# Patient Record
Sex: Female | Born: 1960
Health system: Southern US, Community
[De-identification: ages and names within clinical notes are randomized; demographics above are authoritative.]

## PROBLEM LIST (undated history)

## (undated) DIAGNOSIS — Z808 Family history of malignant neoplasm of other organs or systems: Secondary | ICD-10-CM

## (undated) DIAGNOSIS — Z8041 Family history of malignant neoplasm of ovary: Secondary | ICD-10-CM

## (undated) HISTORY — DX: Family history of malignant neoplasm of ovary: Z80.41

## (undated) HISTORY — DX: Family history of malignant neoplasm of other organs or systems: Z80.8

## (undated) HISTORY — PX: WISDOM TOOTH EXTRACTION: SHX21

---

## 1999-05-08 ENCOUNTER — Encounter: Payer: Self-pay | Admitting: Obstetrics and Gynecology

## 1999-05-08 ENCOUNTER — Ambulatory Visit (HOSPITAL_COMMUNITY): Admission: RE | Admit: 1999-05-08 | Discharge: 1999-05-08 | Payer: Self-pay | Admitting: Obstetrics and Gynecology

## 2000-05-08 ENCOUNTER — Encounter: Payer: Self-pay | Admitting: Obstetrics and Gynecology

## 2000-05-08 ENCOUNTER — Ambulatory Visit (HOSPITAL_COMMUNITY): Admission: RE | Admit: 2000-05-08 | Discharge: 2000-05-08 | Payer: Self-pay | Admitting: Obstetrics and Gynecology

## 2001-04-07 ENCOUNTER — Other Ambulatory Visit: Admission: RE | Admit: 2001-04-07 | Discharge: 2001-04-07 | Payer: Self-pay | Admitting: Obstetrics and Gynecology

## 2001-05-15 ENCOUNTER — Encounter: Payer: Self-pay | Admitting: Obstetrics and Gynecology

## 2001-05-15 ENCOUNTER — Encounter: Admission: RE | Admit: 2001-05-15 | Discharge: 2001-05-15 | Payer: Self-pay | Admitting: Obstetrics and Gynecology

## 2002-05-25 ENCOUNTER — Encounter: Payer: Self-pay | Admitting: Obstetrics and Gynecology

## 2002-05-25 ENCOUNTER — Encounter: Admission: RE | Admit: 2002-05-25 | Discharge: 2002-05-25 | Payer: Self-pay | Admitting: Obstetrics and Gynecology

## 2003-02-18 ENCOUNTER — Other Ambulatory Visit: Admission: RE | Admit: 2003-02-18 | Discharge: 2003-02-18 | Payer: Self-pay | Admitting: Obstetrics and Gynecology

## 2003-06-02 ENCOUNTER — Encounter: Payer: Self-pay | Admitting: Obstetrics and Gynecology

## 2003-06-02 ENCOUNTER — Encounter: Admission: RE | Admit: 2003-06-02 | Discharge: 2003-06-02 | Payer: Self-pay | Admitting: Obstetrics and Gynecology

## 2004-03-28 ENCOUNTER — Other Ambulatory Visit: Admission: RE | Admit: 2004-03-28 | Discharge: 2004-03-28 | Payer: Self-pay | Admitting: Obstetrics and Gynecology

## 2005-04-03 ENCOUNTER — Other Ambulatory Visit: Admission: RE | Admit: 2005-04-03 | Discharge: 2005-04-03 | Payer: Self-pay | Admitting: Obstetrics and Gynecology

## 2005-04-27 ENCOUNTER — Encounter: Admission: RE | Admit: 2005-04-27 | Discharge: 2005-04-27 | Payer: Self-pay | Admitting: Obstetrics and Gynecology

## 2006-04-12 ENCOUNTER — Other Ambulatory Visit: Admission: RE | Admit: 2006-04-12 | Discharge: 2006-04-12 | Payer: Self-pay | Admitting: Obstetrics and Gynecology

## 2006-05-03 ENCOUNTER — Encounter: Admission: RE | Admit: 2006-05-03 | Discharge: 2006-05-03 | Payer: Self-pay | Admitting: Obstetrics and Gynecology

## 2007-05-06 ENCOUNTER — Encounter: Admission: RE | Admit: 2007-05-06 | Discharge: 2007-05-06 | Payer: Self-pay | Admitting: Obstetrics and Gynecology

## 2008-05-06 ENCOUNTER — Encounter: Admission: RE | Admit: 2008-05-06 | Discharge: 2008-05-06 | Payer: Self-pay | Admitting: Obstetrics and Gynecology

## 2009-05-09 ENCOUNTER — Encounter: Admission: RE | Admit: 2009-05-09 | Discharge: 2009-05-09 | Payer: Self-pay | Admitting: Obstetrics and Gynecology

## 2010-05-15 ENCOUNTER — Encounter: Admission: RE | Admit: 2010-05-15 | Discharge: 2010-05-15 | Payer: Self-pay | Admitting: Obstetrics and Gynecology

## 2011-04-09 ENCOUNTER — Other Ambulatory Visit: Payer: Self-pay | Admitting: Obstetrics and Gynecology

## 2011-04-09 DIAGNOSIS — Z1231 Encounter for screening mammogram for malignant neoplasm of breast: Secondary | ICD-10-CM

## 2011-05-18 ENCOUNTER — Ambulatory Visit
Admission: RE | Admit: 2011-05-18 | Discharge: 2011-05-18 | Disposition: A | Discharge status: Home / Self Care | Payer: Private Health Insurance - Indemnity | Source: Ambulatory Visit | Attending: Obstetrics and Gynecology | Admitting: Obstetrics and Gynecology

## 2011-05-18 DIAGNOSIS — Z1231 Encounter for screening mammogram for malignant neoplasm of breast: Secondary | ICD-10-CM

## 2012-04-14 ENCOUNTER — Other Ambulatory Visit: Payer: Self-pay | Admitting: Obstetrics and Gynecology

## 2012-04-14 DIAGNOSIS — Z1231 Encounter for screening mammogram for malignant neoplasm of breast: Secondary | ICD-10-CM

## 2012-05-22 ENCOUNTER — Ambulatory Visit: Payer: Private Health Insurance - Indemnity

## 2012-06-16 ENCOUNTER — Ambulatory Visit
Admission: RE | Admit: 2012-06-16 | Discharge: 2012-06-16 | Disposition: A | Discharge status: Home / Self Care | Payer: 59 | Source: Ambulatory Visit | Attending: Obstetrics and Gynecology | Admitting: Obstetrics and Gynecology

## 2012-06-16 DIAGNOSIS — Z1231 Encounter for screening mammogram for malignant neoplasm of breast: Secondary | ICD-10-CM

## 2013-05-11 ENCOUNTER — Other Ambulatory Visit: Payer: Self-pay

## 2013-05-11 DIAGNOSIS — Z1231 Encounter for screening mammogram for malignant neoplasm of breast: Secondary | ICD-10-CM

## 2013-06-17 ENCOUNTER — Ambulatory Visit
Admission: RE | Admit: 2013-06-17 | Discharge: 2013-06-17 | Disposition: A | Discharge status: Home / Self Care | Payer: 59 | Source: Ambulatory Visit

## 2013-06-17 DIAGNOSIS — Z1231 Encounter for screening mammogram for malignant neoplasm of breast: Secondary | ICD-10-CM

## 2014-05-13 ENCOUNTER — Other Ambulatory Visit: Payer: Self-pay

## 2014-05-13 DIAGNOSIS — Z1231 Encounter for screening mammogram for malignant neoplasm of breast: Secondary | ICD-10-CM

## 2014-06-22 ENCOUNTER — Ambulatory Visit
Admission: RE | Admit: 2014-06-22 | Discharge: 2014-06-22 | Disposition: A | Discharge status: Home / Self Care | Payer: 59 | Source: Ambulatory Visit

## 2014-06-22 DIAGNOSIS — Z1231 Encounter for screening mammogram for malignant neoplasm of breast: Secondary | ICD-10-CM

## 2014-11-03 ENCOUNTER — Ambulatory Visit (HOSPITAL_BASED_OUTPATIENT_CLINIC_OR_DEPARTMENT_OTHER): Payer: 59 | Admitting: Genetic Counselor

## 2014-11-03 ENCOUNTER — Encounter: Payer: Self-pay | Admitting: Genetic Counselor

## 2014-11-03 ENCOUNTER — Ambulatory Visit: Payer: Self-pay

## 2014-11-03 DIAGNOSIS — Z8041 Family history of malignant neoplasm of ovary: Secondary | ICD-10-CM

## 2014-11-03 DIAGNOSIS — Z808 Family history of malignant neoplasm of other organs or systems: Secondary | ICD-10-CM | POA: Insufficient documentation

## 2014-11-03 NOTE — Progress Notes (Signed)
REFERRING PROVIDER: Dr. Mancel Bale  PRIMARY PROVIDER:  No primary care provider on file.  PRIMARY REASON FOR VISIT:  1. Family history of ovarian cancer   2. Family history of glioblastoma      HISTORY OF PRESENT ILLNESS:   Margaret Parks, a 54 y.o. female, was seen for a [place] cancer genetics consultation at the request of Dr. Mancel Bale due to a family history of cancer.  Margaret Parks presents to clinic today to discuss the possibility of a hereditary predisposition to cancer, genetic testing, and to further clarify her future cancer risks, as well as potential cancer risks for family members. Margaret Parks is a 54 y.o. female with no personal history of cancer.    CANCER HISTORY:   No history exists.     HORMONAL RISK FACTORS:  Menarche was at age 2.  First live birth at age 85.  OCP use for approximately 0 years.  Ovaries intact: yes.  Hysterectomy: no.  Menopausal status: postmenopausal.  HRT use: 0 years. Colonoscopy: yes; normal. Mammogram within the last year: yes. Number of breast biopsies: 0. Up to date with pelvic exams:  yes. Any excessive radiation exposure in the past:  no  Past Medical History  Diagnosis Date  . Family history of ovarian cancer   . Family history of glioblastoma     Past Surgical History  Procedure Laterality Date  . Wisdom tooth extraction      History   Social History  . Marital Status: Married    Spouse Name: N/A  . Number of Children: 2  . Years of Education: N/A   Social History Main Topics  . Smoking status: Never Smoker   . Smokeless tobacco: Not on file  . Alcohol Use: Yes     Comment: very occasional  . Drug Use: Not on file  . Sexual Activity: Not on file   Other Topics Concern  . None   Social History Narrative  . None     FAMILY HISTORY:  We obtained a detailed, 4-generation family history.  Significant diagnoses are listed below: Family History  Problem Relation Age of Onset  . Stroke Mother   . Heart attack  Father   . Ovarian cancer Sister 36    unilateral fallopian tube cancer  . Colon polyps Brother   . Ovarian cancer Paternal Aunt 48  . Heart attack Paternal Uncle   . Multiple myeloma Maternal Grandmother   . Bone cancer Maternal Grandfather   . Heart attack Paternal Grandfather   . Brain cancer Brother 30    glioblastoma  . Lung cancer Maternal Uncle    Margaret Parks has one sister and two brothers.  Her youngest brother had a glioblastoma diagnosed at 31 and died at 10.  Her sister recently underwent a hysterectomy and an early fallopian tube cancer was found.  No chemotherapy was needed for this. Her father died of a heart attack, as did one of his brothers.  Her father's sister died of ovarian cancer.  The paternal grandparents died of heart attack and old age.  Margaret Parks maternal family history is non contributory. Patient's maternal ancestors are of [place] and [place] descent, and paternal ancestors are of [place] and [place] descent. There is no reported Ashkenazi Jewish ancestry. There is no known consanguinity.  GENETIC COUNSELING ASSESSMENT: Margaret Parks is a 54 y.o. female with a family history of ovarian cancer which somewhat suggestive of a hereditary cancer syndrome and predisposition to cancer. We, therefore, discussed and  recommended the following at today's visit.   DISCUSSION: We reviewed the characteristics, features and inheritance patterns of hereditary cancer syndromes. We discussed that the most common cause of hereditary ovarian cancer is the result of a BRCA mutation.  Lynch syndrome also makes up a good proportion of hereditary ovarian cancer.  Less common genes can increase the risk as well.  We also discussed genetic testing, including the appropriate family members to test, the process of testing, insurance coverage and turn-around-time for results. We discussed the implications of a negative, positive and/or variant of uncertain significant result. Based on her  sister's diagnosis we discussed that her sister is a more informative person to perform genetic testing upon.  Margaret Parks does meet criteria, but having her sister tested could provide a specific gene to test for.  If Margaret Parks decides to undergo genetic testing, we recommended Margaret Parks pursue genetic testing for the Breast/ovarian cnacer gene panel. The Breast/Ovarian gene panel offered by [facility] includes sequencing and rearrangement analysis for the following 21 genes:  ATM, BARD1, BRCA1, BRCA2, BRIP1, CDH1, CHEK2, EPCAM, FANCC, MLH1, MSH2, MSH6, NBN, PALB2, PMS2, PTEN, RAD51C, RAD51D, STK11, TP53, and XRCC2.     PLAN: Despite our recommendation, Margaret Parks did not wish to pursue genetic testing at today's visit. We understand this decision, and remain available to coordinate genetic testing at any time in the future. Based on Margaret Parks's family history, we recommended her sister, who was diagnosed with fallopian tube cancer at age 55, have genetic counseling and testing. Margaret Parks will let us know if we can be of any assistance in coordinating genetic counseling and/or testing for this family member. Once we have her sister's test results back, Margaret Parks will r/s her genetic testing appointment.  Lastly, we encouraged Margaret Parks to remain in contact with cancer genetics annually so that we can continuously update the family history and inform her of any changes in cancer genetics and testing that may be of benefit for this family.   Ms.  Parks questions were answered to her satisfaction today. Our contact information was provided should additional questions or concerns arise. Thank you for the referral and allowing [place] to share in the care of your patient.   Rosine Solecki P. Florene Glen, [place], [facility] Certified Genetic Counselor [email or web address] phone: [phone]  The patient was seen for a total of 60 minutes in face-to-face genetic counseling.  This patient was discussed with Drs. Magrinat,  Lindi Adie and/or Burr Medico who agrees with the above.    _______________________________________________________________________ For Office Staff:  Number of people involved in session: 1 Was an Intern/ student involved with case: no

## 2014-11-15 ENCOUNTER — Telehealth: Payer: Self-pay | Admitting: Genetic Counselor

## 2014-11-15 NOTE — Telephone Encounter (Signed)
Pt appt with  Lab for genetic testing is 11/16/14@9 :45

## 2014-11-16 ENCOUNTER — Other Ambulatory Visit: Payer: 59

## 2014-12-06 ENCOUNTER — Encounter: Payer: Self-pay | Admitting: Genetic Counselor

## 2014-12-06 ENCOUNTER — Telehealth: Payer: Self-pay | Admitting: Genetic Counselor

## 2014-12-06 DIAGNOSIS — Z8041 Family history of malignant neoplasm of ovary: Secondary | ICD-10-CM

## 2014-12-06 DIAGNOSIS — Z1379 Encounter for other screening for genetic and chromosomal anomalies: Secondary | ICD-10-CM

## 2014-12-06 NOTE — Telephone Encounter (Signed)
Revealed negative genetic testing on the Breast/Ovarian cancer panel.  Discussed that genetic testing is most informative on someone who has had cancer. Her sister, who had fallopian tube cancer, would be the most informative person for genetic testing in Shaana's family.  Ms. Margaret Parks understands, but is happy with her negative results.

## 2014-12-06 NOTE — Progress Notes (Signed)
HPI: Ms. Woolford was previously seen in the [place] clinic due to a family history of cancer and concerns regarding a hereditary predisposition to cancer. Please refer to our prior cancer genetics clinic note for more information regarding Ms. Wendorff's medical, social and family histories, and our assessment and recommendations, at the time. Ms. Alcaide recent genetic test results were disclosed to her, as were recommendations warranted by these results. These results and recommendations are discussed in more detail below.  GENETIC TEST RESULTS: At the time of Ms. Mcelroy's visit, we recommended she pursue genetic testing of the Breast/Ovarian cancer gene panel. The Breast/Ovarian gene panel offered by [facility] includes sequencing and rearrangement analysis for the following 21 genes:  ATM, BARD1, BRCA1, BRCA2, BRIP1, CDH1, CHEK2, EPCAM, FANCC, MLH1, MSH2, MSH6, NBN, PALB2, PMS2, PTEN, RAD51C, RAD51D, STK11, TP53, and XRCC2.   The report date is December 01, 2014.  Genetic testing was normal, and did not reveal a deleterious mutation in these genes. The test report has been scanned into EPIC and is located under the Media tab.   We discussed with Ms. Thorley that since the current genetic testing is not perfect, it is possible there may be a gene mutation in one of these genes that current testing cannot detect, but that chance is small. We also discussed, that it is possible that another gene that has not yet been discovered, or that we have not yet tested, is responsible for the cancer diagnoses in the family, and it is, therefore, important to remain in touch with cancer genetics in the future so that we can continue to offer Ms. Montoro the most up to date genetic testing.   CANCER SCREENING RECOMMENDATIONS: This normal result is reassuring and indicates that Ms. Hersch does not likely have an increased risk of cancer due to a mutation in one of these genes.  We, therefore, recommended  Ms.  Cast continue to follow the cancer screening guidelines provided by her primary healthcare providers.   CANCER SCREENING RECOMMENDATIONS: This result is reassuring and suggests that Ms. Chaudhari likely does not have an increased risk for a future cancer due to a mutation in one of these genes.  This normal test also suggests and Ms. Gsell is not at high risk for developing cancer due to an inherited predisposition associated with one of these genes. We, therefore, recommended she continue to follow the cancer management and screening guidelines provided by her oncology and primary providers.   RECOMMENDATIONS FOR FAMILY MEMBERS: Women in this family might be at some increased risk of developing cancer, over the general population risk, simply due to the family history of cancer. We recommended women in this family have a yearly mammogram beginning at age 29, or 27 years younger than the earliest onset of cancer, an an annual clinical breast exam, and perform monthly breast self-exams. Women in this family should also have a gynecological exam as recommended by their primary provider. All family members should have a colonoscopy by age 60.  FOLLOW-UP: Lastly, we discussed with Ms. Santini that cancer genetics is a rapidly advancing field and it is possible that new genetic tests will be appropriate for her and/or her family members in the future. We encouraged her to remain in contact with cancer genetics on an annual basis so we can update her personal and family histories and let her know of advances in cancer genetics that may benefit this family.   Our contact number was provided. Ms. Fehr questions  were answered to her satisfaction, and she knows she is welcome to call us at anytime with additional questions or concerns.   Roma Kayser, MS, [facility] Certified Genetic Counselor [email or web address]

## 2015-04-13 DIAGNOSIS — M79673 Pain in unspecified foot: Secondary | ICD-10-CM

## 2015-05-23 ENCOUNTER — Other Ambulatory Visit: Payer: Self-pay

## 2015-05-23 DIAGNOSIS — Z1231 Encounter for screening mammogram for malignant neoplasm of breast: Secondary | ICD-10-CM

## 2015-06-28 ENCOUNTER — Ambulatory Visit
Admission: RE | Admit: 2015-06-28 | Discharge: 2015-06-28 | Disposition: A | Discharge status: Home / Self Care | Payer: Commercial Managed Care - PPO | Source: Ambulatory Visit

## 2015-06-28 DIAGNOSIS — Z1231 Encounter for screening mammogram for malignant neoplasm of breast: Secondary | ICD-10-CM

## 2016-05-25 ENCOUNTER — Other Ambulatory Visit: Payer: Self-pay | Admitting: Obstetrics and Gynecology

## 2016-05-25 DIAGNOSIS — Z1231 Encounter for screening mammogram for malignant neoplasm of breast: Secondary | ICD-10-CM

## 2016-07-03 ENCOUNTER — Ambulatory Visit
Admission: RE | Admit: 2016-07-03 | Discharge: 2016-07-03 | Disposition: A | Discharge status: Home / Self Care | Payer: Commercial Managed Care - PPO | Source: Ambulatory Visit | Attending: Obstetrics and Gynecology | Admitting: Obstetrics and Gynecology

## 2016-07-03 DIAGNOSIS — Z1231 Encounter for screening mammogram for malignant neoplasm of breast: Secondary | ICD-10-CM

## 2017-05-28 ENCOUNTER — Other Ambulatory Visit: Payer: Self-pay | Admitting: Obstetrics and Gynecology

## 2017-05-28 DIAGNOSIS — Z1231 Encounter for screening mammogram for malignant neoplasm of breast: Secondary | ICD-10-CM

## 2017-06-19 DIAGNOSIS — D485 Neoplasm of uncertain behavior of skin: Secondary | ICD-10-CM | POA: Diagnosis not present

## 2017-07-05 ENCOUNTER — Ambulatory Visit: Payer: Commercial Managed Care - PPO

## 2017-08-02 ENCOUNTER — Ambulatory Visit
Admission: RE | Admit: 2017-08-02 | Discharge: 2017-08-02 | Disposition: A | Discharge status: Home / Self Care | Payer: Self-pay | Source: Ambulatory Visit | Attending: Obstetrics and Gynecology | Admitting: Obstetrics and Gynecology

## 2017-08-02 DIAGNOSIS — Z1231 Encounter for screening mammogram for malignant neoplasm of breast: Secondary | ICD-10-CM

## 2017-09-03 DIAGNOSIS — L820 Inflamed seborrheic keratosis: Secondary | ICD-10-CM | POA: Diagnosis not present

## 2017-09-17 DIAGNOSIS — C441192 Basal cell carcinoma of skin of left lower eyelid, including canthus: Secondary | ICD-10-CM | POA: Diagnosis not present

## 2017-12-31 DIAGNOSIS — M19071 Primary osteoarthritis, right ankle and foot: Secondary | ICD-10-CM | POA: Diagnosis not present

## 2017-12-31 DIAGNOSIS — M19072 Primary osteoarthritis, left ankle and foot: Secondary | ICD-10-CM | POA: Diagnosis not present

## 2017-12-31 DIAGNOSIS — M71572 Other bursitis, not elsewhere classified, left ankle and foot: Secondary | ICD-10-CM | POA: Diagnosis not present

## 2017-12-31 DIAGNOSIS — M71571 Other bursitis, not elsewhere classified, right ankle and foot: Secondary | ICD-10-CM | POA: Diagnosis not present

## 2017-12-31 DIAGNOSIS — M76821 Posterior tibial tendinitis, right leg: Secondary | ICD-10-CM | POA: Diagnosis not present

## 2017-12-31 DIAGNOSIS — M76822 Posterior tibial tendinitis, left leg: Secondary | ICD-10-CM | POA: Diagnosis not present

## 2018-01-01 DIAGNOSIS — Z01419 Encounter for gynecological examination (general) (routine) without abnormal findings: Secondary | ICD-10-CM | POA: Diagnosis not present

## 2018-01-01 DIAGNOSIS — Z6826 Body mass index (BMI) 26.0-26.9, adult: Secondary | ICD-10-CM | POA: Diagnosis not present

## 2018-01-01 DIAGNOSIS — E559 Vitamin D deficiency, unspecified: Secondary | ICD-10-CM | POA: Diagnosis not present

## 2018-01-02 DIAGNOSIS — E785 Hyperlipidemia, unspecified: Secondary | ICD-10-CM | POA: Diagnosis not present

## 2018-01-02 DIAGNOSIS — Z0000 Encounter for general adult medical examination without abnormal findings: Secondary | ICD-10-CM | POA: Diagnosis not present

## 2018-01-07 DIAGNOSIS — M76822 Posterior tibial tendinitis, left leg: Secondary | ICD-10-CM | POA: Diagnosis not present

## 2018-01-07 DIAGNOSIS — M76821 Posterior tibial tendinitis, right leg: Secondary | ICD-10-CM | POA: Diagnosis not present

## 2018-01-16 DIAGNOSIS — L821 Other seborrheic keratosis: Secondary | ICD-10-CM | POA: Diagnosis not present

## 2018-01-16 DIAGNOSIS — L720 Epidermal cyst: Secondary | ICD-10-CM | POA: Diagnosis not present

## 2018-01-16 DIAGNOSIS — Q825 Congenital non-neoplastic nevus: Secondary | ICD-10-CM | POA: Diagnosis not present

## 2018-04-18 DIAGNOSIS — L02212 Cutaneous abscess of back [any part, except buttock]: Secondary | ICD-10-CM | POA: Diagnosis not present

## 2018-04-22 DIAGNOSIS — M238X2 Other internal derangements of left knee: Secondary | ICD-10-CM | POA: Diagnosis not present

## 2018-04-22 DIAGNOSIS — M25562 Pain in left knee: Secondary | ICD-10-CM | POA: Diagnosis not present

## 2018-05-02 DIAGNOSIS — L02212 Cutaneous abscess of back [any part, except buttock]: Secondary | ICD-10-CM | POA: Diagnosis not present

## 2018-05-07 DIAGNOSIS — S30860A Insect bite (nonvenomous) of lower back and pelvis, initial encounter: Secondary | ICD-10-CM | POA: Diagnosis not present

## 2018-05-29 DIAGNOSIS — H16223 Keratoconjunctivitis sicca, not specified as Sjogren's, bilateral: Secondary | ICD-10-CM | POA: Diagnosis not present

## 2018-06-04 DIAGNOSIS — M94262 Chondromalacia, left knee: Secondary | ICD-10-CM | POA: Diagnosis not present

## 2018-06-04 DIAGNOSIS — M25562 Pain in left knee: Secondary | ICD-10-CM | POA: Diagnosis not present

## 2018-06-17 DIAGNOSIS — L309 Dermatitis, unspecified: Secondary | ICD-10-CM | POA: Diagnosis not present

## 2018-07-03 ENCOUNTER — Other Ambulatory Visit: Payer: Self-pay | Admitting: Obstetrics and Gynecology

## 2018-07-03 DIAGNOSIS — Z1231 Encounter for screening mammogram for malignant neoplasm of breast: Secondary | ICD-10-CM

## 2018-08-18 ENCOUNTER — Ambulatory Visit
Admission: RE | Admit: 2018-08-18 | Discharge: 2018-08-18 | Disposition: A | Discharge status: Home / Self Care | Payer: BLUE CROSS/BLUE SHIELD | Source: Ambulatory Visit | Attending: Obstetrics and Gynecology | Admitting: Obstetrics and Gynecology

## 2018-08-18 DIAGNOSIS — Z1231 Encounter for screening mammogram for malignant neoplasm of breast: Secondary | ICD-10-CM

## 2018-08-30 DIAGNOSIS — H109 Unspecified conjunctivitis: Secondary | ICD-10-CM | POA: Diagnosis not present

## 2018-09-08 DIAGNOSIS — H16223 Keratoconjunctivitis sicca, not specified as Sjogren's, bilateral: Secondary | ICD-10-CM | POA: Diagnosis not present

## 2018-09-14 DIAGNOSIS — J018 Other acute sinusitis: Secondary | ICD-10-CM | POA: Diagnosis not present

## 2018-10-17 DIAGNOSIS — E785 Hyperlipidemia, unspecified: Secondary | ICD-10-CM | POA: Diagnosis not present

## 2018-12-23 DIAGNOSIS — W57XXXA Bitten or stung by nonvenomous insect and other nonvenomous arthropods, initial encounter: Secondary | ICD-10-CM | POA: Diagnosis not present

## 2018-12-23 DIAGNOSIS — S30860A Insect bite (nonvenomous) of lower back and pelvis, initial encounter: Secondary | ICD-10-CM | POA: Diagnosis not present

## 2018-12-23 DIAGNOSIS — L821 Other seborrheic keratosis: Secondary | ICD-10-CM | POA: Diagnosis not present

## 2018-12-23 DIAGNOSIS — D1801 Hemangioma of skin and subcutaneous tissue: Secondary | ICD-10-CM | POA: Diagnosis not present

## 2019-01-23 DIAGNOSIS — M238X2 Other internal derangements of left knee: Secondary | ICD-10-CM | POA: Diagnosis not present

## 2019-01-23 DIAGNOSIS — M25562 Pain in left knee: Secondary | ICD-10-CM | POA: Diagnosis not present

## 2019-02-03 DIAGNOSIS — E559 Vitamin D deficiency, unspecified: Secondary | ICD-10-CM | POA: Diagnosis not present

## 2019-02-03 DIAGNOSIS — Z8262 Family history of osteoporosis: Secondary | ICD-10-CM | POA: Diagnosis not present

## 2019-02-03 DIAGNOSIS — Z01419 Encounter for gynecological examination (general) (routine) without abnormal findings: Secondary | ICD-10-CM | POA: Diagnosis not present

## 2019-02-03 DIAGNOSIS — Z6826 Body mass index (BMI) 26.0-26.9, adult: Secondary | ICD-10-CM | POA: Diagnosis not present

## 2019-02-17 DIAGNOSIS — Z8262 Family history of osteoporosis: Secondary | ICD-10-CM | POA: Diagnosis not present

## 2019-04-08 DIAGNOSIS — L814 Other melanin hyperpigmentation: Secondary | ICD-10-CM | POA: Diagnosis not present

## 2019-04-08 DIAGNOSIS — Q825 Congenital non-neoplastic nevus: Secondary | ICD-10-CM | POA: Diagnosis not present

## 2019-04-08 DIAGNOSIS — D225 Melanocytic nevi of trunk: Secondary | ICD-10-CM | POA: Diagnosis not present

## 2019-04-08 DIAGNOSIS — Z85828 Personal history of other malignant neoplasm of skin: Secondary | ICD-10-CM | POA: Diagnosis not present

## 2019-04-25 DIAGNOSIS — S52691A Other fracture of lower end of right ulna, initial encounter for closed fracture: Secondary | ICD-10-CM | POA: Diagnosis not present

## 2019-04-25 DIAGNOSIS — W1839XA Other fall on same level, initial encounter: Secondary | ICD-10-CM | POA: Diagnosis not present

## 2019-04-25 DIAGNOSIS — Z01818 Encounter for other preprocedural examination: Secondary | ICD-10-CM | POA: Diagnosis not present

## 2019-04-25 DIAGNOSIS — S6991XA Unspecified injury of right wrist, hand and finger(s), initial encounter: Secondary | ICD-10-CM | POA: Diagnosis not present

## 2019-04-25 DIAGNOSIS — S52611A Displaced fracture of right ulna styloid process, initial encounter for closed fracture: Secondary | ICD-10-CM | POA: Diagnosis not present

## 2019-04-25 DIAGNOSIS — Z01812 Encounter for preprocedural laboratory examination: Secondary | ICD-10-CM | POA: Diagnosis not present

## 2019-04-25 DIAGNOSIS — W19XXXA Unspecified fall, initial encounter: Secondary | ICD-10-CM | POA: Diagnosis not present

## 2019-04-25 DIAGNOSIS — S52591A Other fractures of lower end of right radius, initial encounter for closed fracture: Secondary | ICD-10-CM | POA: Diagnosis not present

## 2019-04-25 DIAGNOSIS — Z20828 Contact with and (suspected) exposure to other viral communicable diseases: Secondary | ICD-10-CM | POA: Diagnosis not present

## 2019-04-25 DIAGNOSIS — S52501A Unspecified fracture of the lower end of right radius, initial encounter for closed fracture: Secondary | ICD-10-CM | POA: Diagnosis not present

## 2019-04-25 DIAGNOSIS — Y999 Unspecified external cause status: Secondary | ICD-10-CM | POA: Diagnosis not present

## 2019-05-07 DIAGNOSIS — S52551D Other extraarticular fracture of lower end of right radius, subsequent encounter for closed fracture with routine healing: Secondary | ICD-10-CM | POA: Diagnosis not present

## 2019-05-07 DIAGNOSIS — S52301D Unspecified fracture of shaft of right radius, subsequent encounter for closed fracture with routine healing: Secondary | ICD-10-CM | POA: Diagnosis not present

## 2019-05-07 DIAGNOSIS — X58XXXD Exposure to other specified factors, subsequent encounter: Secondary | ICD-10-CM | POA: Diagnosis not present

## 2019-05-07 DIAGNOSIS — S52611D Displaced fracture of right ulna styloid process, subsequent encounter for closed fracture with routine healing: Secondary | ICD-10-CM | POA: Diagnosis not present

## 2019-05-21 DIAGNOSIS — X58XXXD Exposure to other specified factors, subsequent encounter: Secondary | ICD-10-CM | POA: Diagnosis not present

## 2019-05-21 DIAGNOSIS — S52611D Displaced fracture of right ulna styloid process, subsequent encounter for closed fracture with routine healing: Secondary | ICD-10-CM | POA: Diagnosis not present

## 2019-05-21 DIAGNOSIS — S52551D Other extraarticular fracture of lower end of right radius, subsequent encounter for closed fracture with routine healing: Secondary | ICD-10-CM | POA: Diagnosis not present

## 2019-06-22 DIAGNOSIS — E785 Hyperlipidemia, unspecified: Secondary | ICD-10-CM | POA: Diagnosis not present

## 2019-06-22 DIAGNOSIS — Z0000 Encounter for general adult medical examination without abnormal findings: Secondary | ICD-10-CM | POA: Diagnosis not present

## 2019-06-24 DIAGNOSIS — Z0000 Encounter for general adult medical examination without abnormal findings: Secondary | ICD-10-CM | POA: Diagnosis not present

## 2019-06-29 DIAGNOSIS — S52551D Other extraarticular fracture of lower end of right radius, subsequent encounter for closed fracture with routine healing: Secondary | ICD-10-CM | POA: Diagnosis not present

## 2019-07-02 DIAGNOSIS — S52551D Other extraarticular fracture of lower end of right radius, subsequent encounter for closed fracture with routine healing: Secondary | ICD-10-CM | POA: Diagnosis not present

## 2019-07-09 DIAGNOSIS — Z8781 Personal history of (healed) traumatic fracture: Secondary | ICD-10-CM | POA: Diagnosis not present

## 2019-07-09 DIAGNOSIS — M810 Age-related osteoporosis without current pathological fracture: Secondary | ICD-10-CM | POA: Diagnosis not present

## 2019-07-09 DIAGNOSIS — Z8262 Family history of osteoporosis: Secondary | ICD-10-CM | POA: Diagnosis not present

## 2019-07-13 ENCOUNTER — Other Ambulatory Visit: Payer: Self-pay | Admitting: Obstetrics and Gynecology

## 2019-07-13 DIAGNOSIS — Z1231 Encounter for screening mammogram for malignant neoplasm of breast: Secondary | ICD-10-CM

## 2019-07-13 DIAGNOSIS — S52551D Other extraarticular fracture of lower end of right radius, subsequent encounter for closed fracture with routine healing: Secondary | ICD-10-CM | POA: Diagnosis not present

## 2019-07-15 DIAGNOSIS — S52551D Other extraarticular fracture of lower end of right radius, subsequent encounter for closed fracture with routine healing: Secondary | ICD-10-CM | POA: Diagnosis not present

## 2019-07-20 DIAGNOSIS — S52551D Other extraarticular fracture of lower end of right radius, subsequent encounter for closed fracture with routine healing: Secondary | ICD-10-CM | POA: Diagnosis not present

## 2019-07-22 DIAGNOSIS — S52551D Other extraarticular fracture of lower end of right radius, subsequent encounter for closed fracture with routine healing: Secondary | ICD-10-CM | POA: Diagnosis not present

## 2019-07-27 DIAGNOSIS — S52551D Other extraarticular fracture of lower end of right radius, subsequent encounter for closed fracture with routine healing: Secondary | ICD-10-CM | POA: Diagnosis not present

## 2019-08-04 DIAGNOSIS — S52551D Other extraarticular fracture of lower end of right radius, subsequent encounter for closed fracture with routine healing: Secondary | ICD-10-CM | POA: Diagnosis not present

## 2019-08-06 DIAGNOSIS — S52551D Other extraarticular fracture of lower end of right radius, subsequent encounter for closed fracture with routine healing: Secondary | ICD-10-CM | POA: Diagnosis not present

## 2019-08-27 DIAGNOSIS — X58XXXD Exposure to other specified factors, subsequent encounter: Secondary | ICD-10-CM | POA: Diagnosis not present

## 2019-08-27 DIAGNOSIS — M19031 Primary osteoarthritis, right wrist: Secondary | ICD-10-CM | POA: Diagnosis not present

## 2019-08-27 DIAGNOSIS — S52551D Other extraarticular fracture of lower end of right radius, subsequent encounter for closed fracture with routine healing: Secondary | ICD-10-CM | POA: Diagnosis not present

## 2019-09-02 ENCOUNTER — Ambulatory Visit
Admission: RE | Admit: 2019-09-02 | Discharge: 2019-09-02 | Disposition: A | Discharge status: Home / Self Care | Payer: BC Managed Care – PPO | Source: Ambulatory Visit | Attending: Obstetrics and Gynecology | Admitting: Obstetrics and Gynecology

## 2019-09-02 ENCOUNTER — Other Ambulatory Visit: Payer: Self-pay

## 2019-09-02 DIAGNOSIS — Z1231 Encounter for screening mammogram for malignant neoplasm of breast: Secondary | ICD-10-CM

## 2019-10-18 ENCOUNTER — Ambulatory Visit: Payer: BC Managed Care – PPO | Attending: Internal Medicine

## 2019-10-18 DIAGNOSIS — Z23 Encounter for immunization: Secondary | ICD-10-CM | POA: Insufficient documentation

## 2019-10-18 NOTE — Progress Notes (Signed)
   Covid-19 Vaccination Clinic  Name:  Margaret Parks    MRN: [id number] DOB: 02/05/1961  10/18/2019  [name] was observed post Covid-19 immunization for 15 minutes without incidence. She was provided with Vaccine Information Sheet and instruction to access the V-Safe system.   [name] was instructed to call 911 with any severe reactions post vaccine: Marland Kitchen Difficulty breathing  . Swelling of your face and throat  . A fast heartbeat  . A bad rash all over your body  . Dizziness and weakness    Immunizations Administered    Name Date Dose VIS Date Route   Pfizer COVID-19 Vaccine 10/18/2019 10:11 AM 0.3 mL 07/31/2019 Intramuscular   Manufacturer: [employer], [employer]   Lot: [id number]   NDC: [address]-1

## 2019-11-07 ENCOUNTER — Ambulatory Visit: Payer: BC Managed Care – PPO | Attending: Internal Medicine

## 2019-11-07 DIAGNOSIS — Z23 Encounter for immunization: Secondary | ICD-10-CM

## 2019-11-07 NOTE — Progress Notes (Signed)
   Covid-19 Vaccination Clinic  Name:  Margaret Parks    MRN: [id number] DOB: 07/25/1961  11/07/2019  Margaret Parks was observed post Covid-19 immunization for 15 minutes without incident. She was provided with Vaccine Information Sheet and instruction to access the V-Safe system.   Margaret Parks was instructed to call 911 with any severe reactions post vaccine: Marland Kitchen Difficulty breathing  . Swelling of face and throat  . A fast heartbeat  . A bad rash all over body  . Dizziness and weakness   Immunizations Administered    Name Date Dose VIS Date Route   Pfizer COVID-19 Vaccine 11/07/2019  9:29 AM 0.3 mL 07/31/2019 Intramuscular   Manufacturer: [employer], [employer]   Lot: [id number]   NDC: [address]-1

## 2019-12-22 DIAGNOSIS — Z8262 Family history of osteoporosis: Secondary | ICD-10-CM | POA: Diagnosis not present

## 2019-12-22 DIAGNOSIS — M810 Age-related osteoporosis without current pathological fracture: Secondary | ICD-10-CM | POA: Diagnosis not present

## 2019-12-22 DIAGNOSIS — Z8781 Personal history of (healed) traumatic fracture: Secondary | ICD-10-CM | POA: Diagnosis not present

## 2020-02-09 DIAGNOSIS — Z6822 Body mass index (BMI) 22.0-22.9, adult: Secondary | ICD-10-CM | POA: Diagnosis not present

## 2020-02-09 DIAGNOSIS — Z01419 Encounter for gynecological examination (general) (routine) without abnormal findings: Secondary | ICD-10-CM | POA: Diagnosis not present

## 2020-02-09 DIAGNOSIS — M8580 Other specified disorders of bone density and structure, unspecified site: Secondary | ICD-10-CM | POA: Diagnosis not present

## 2020-02-09 DIAGNOSIS — Z124 Encounter for screening for malignant neoplasm of cervix: Secondary | ICD-10-CM | POA: Diagnosis not present

## 2020-02-27 IMAGING — MG DIGITAL SCREENING BILATERAL MAMMOGRAM WITH CAD
4 series · 4 of 4 positions shown · non-contrast
Comparison: Previous exam(s).

CLINICAL DATA: Screening.

EXAM:
DIGITAL SCREENING BILATERAL MAMMOGRAM WITH CAD

[L CC]
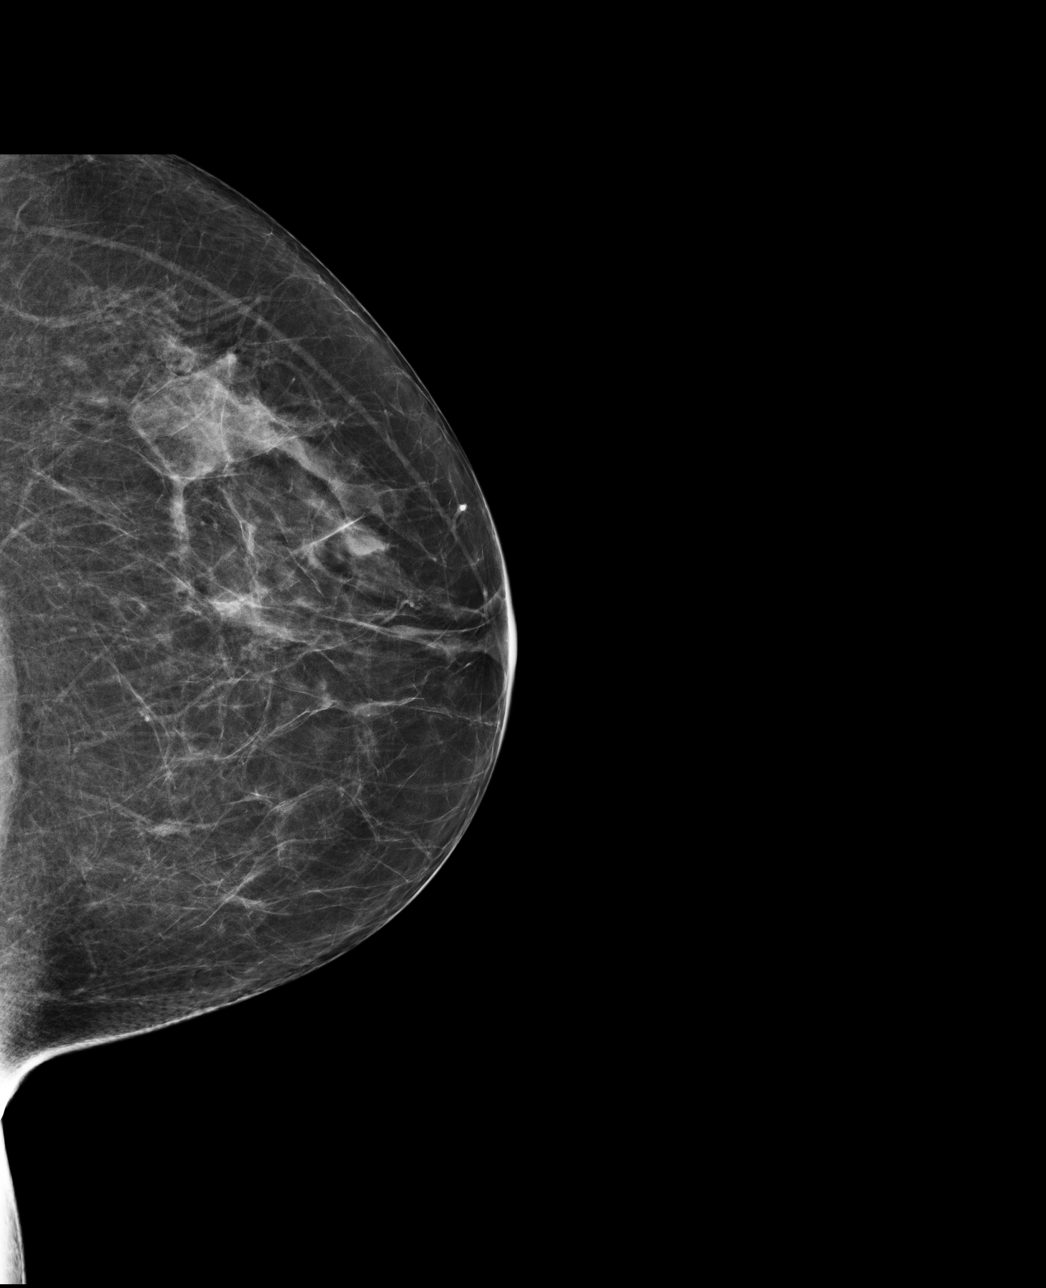

[R CC]
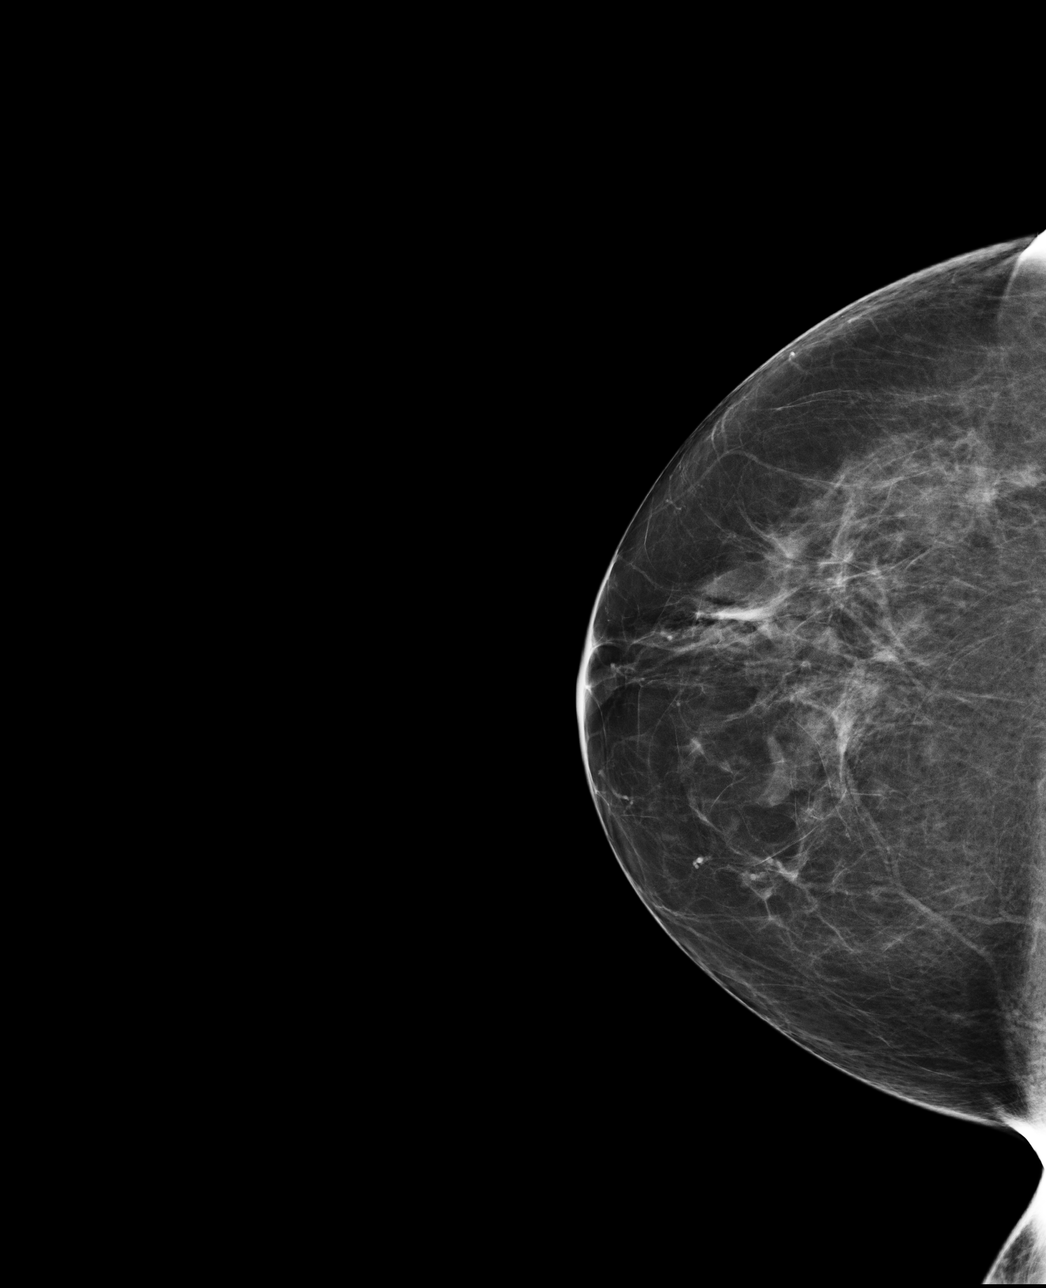

[L MLO]
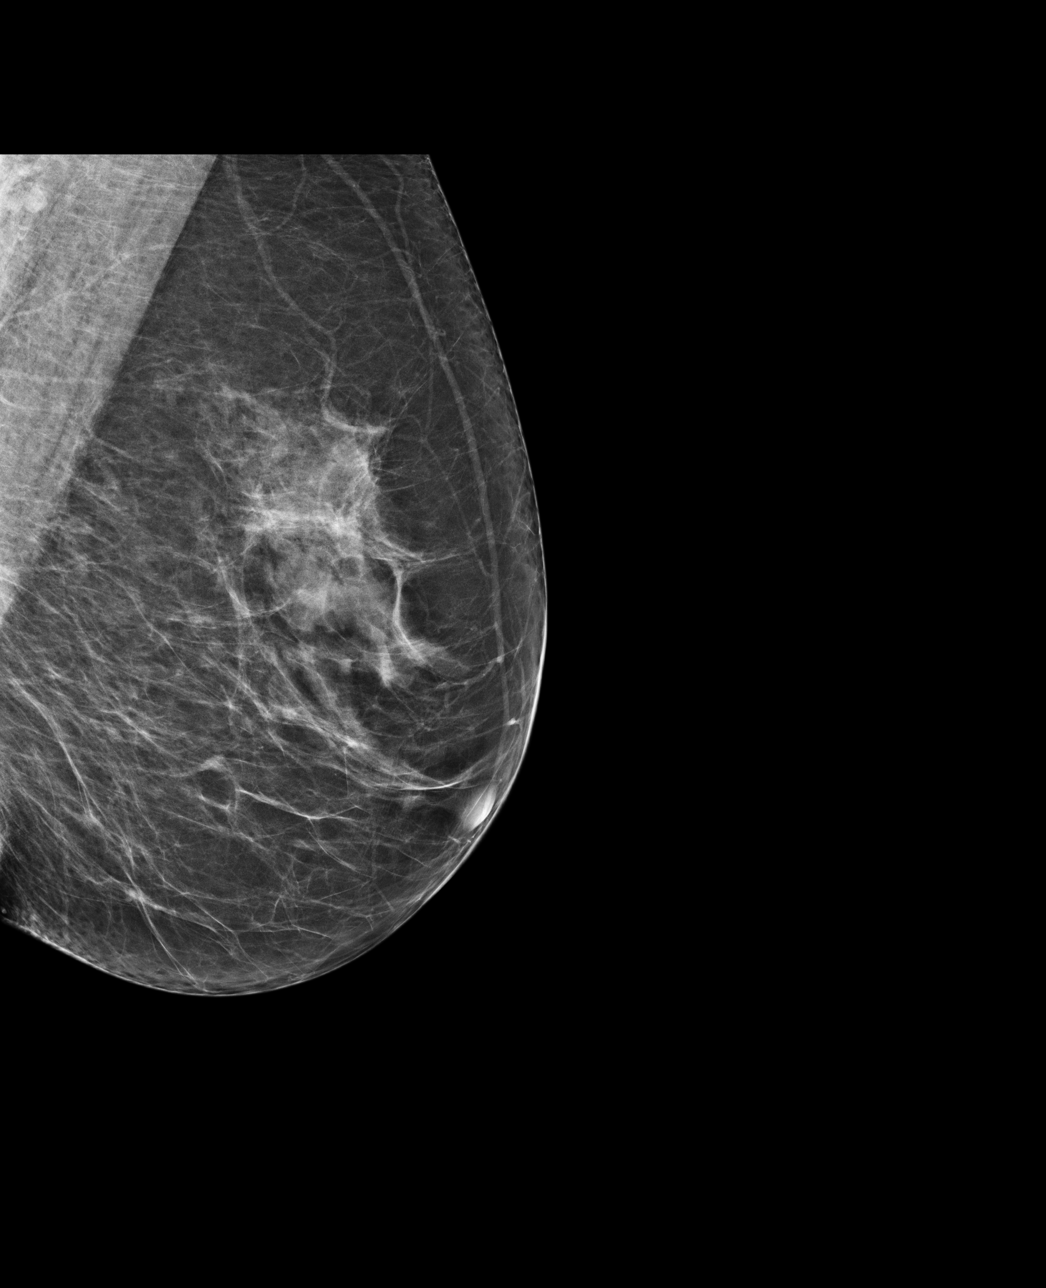

[R MLO]
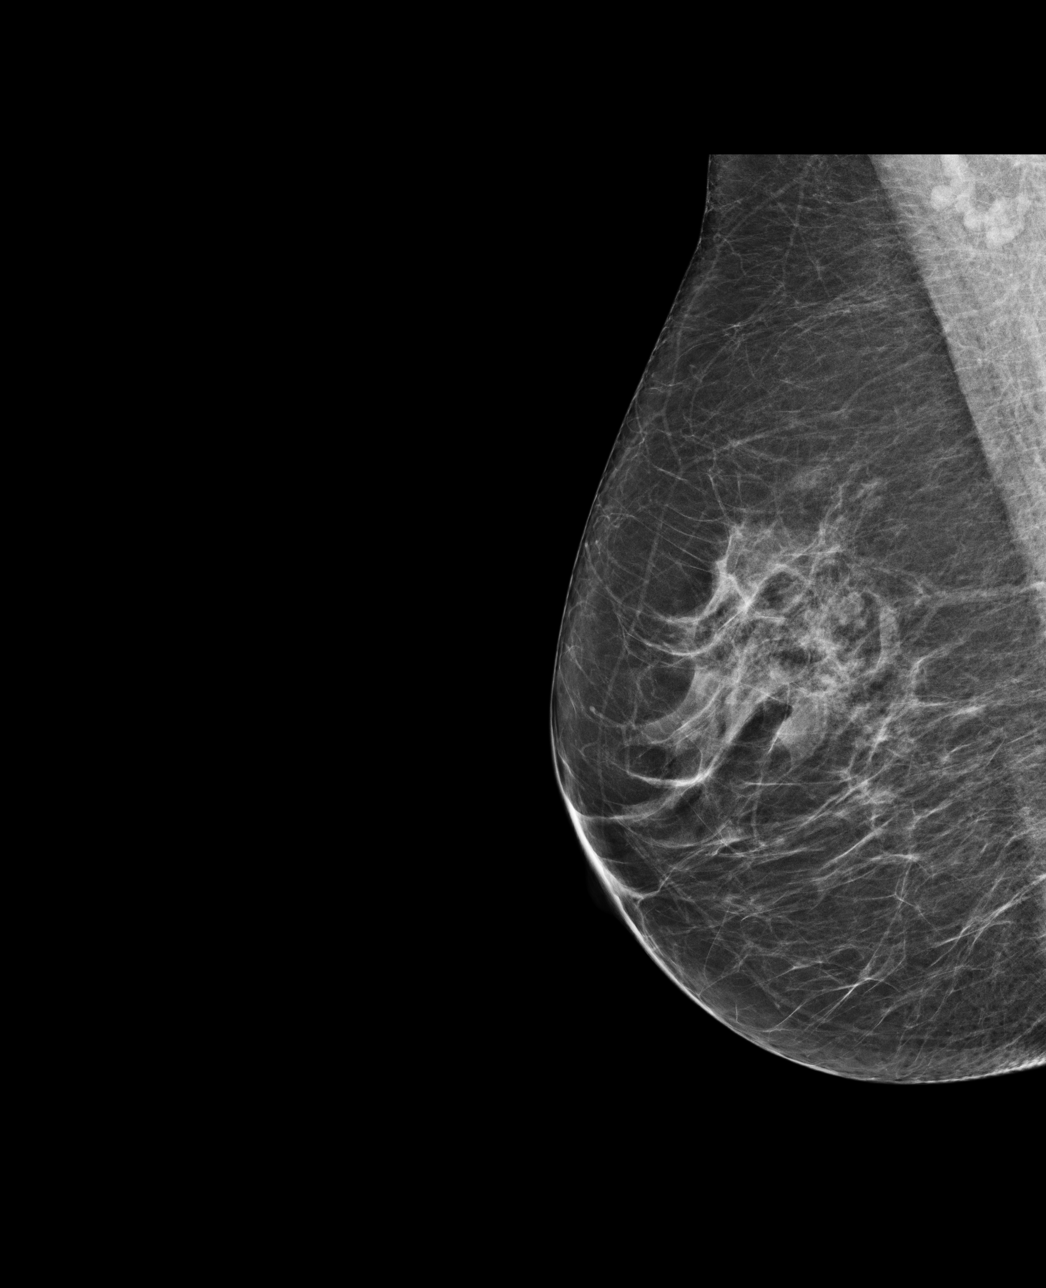

[4 of 4 positions shown; findings below may reference images not displayed]

ACR Breast Density Category c: The breast tissue is heterogeneously
dense, which may obscure small masses.
FINDINGS: There are no findings suspicious for malignancy. Images were
processed with CAD.
IMPRESSION: No mammographic evidence of malignancy. A result letter of this
screening mammogram will be mailed directly to the patient.

RECOMMENDATION:
Screening mammogram in one year. (Code:[id number])

BI-RADS CATEGORY  1: Negative.

## 2020-04-08 ENCOUNTER — Emergency Department (HOSPITAL_COMMUNITY)
Admission: EM | Admit: 2020-04-08 | Discharge: 2020-04-09 | Disposition: A | Discharge status: Home / Self Care | Payer: BC Managed Care – PPO | Attending: Emergency Medicine | Admitting: Emergency Medicine

## 2020-04-08 ENCOUNTER — Encounter (HOSPITAL_COMMUNITY): Payer: Self-pay | Admitting: Pediatrics

## 2020-04-08 ENCOUNTER — Other Ambulatory Visit: Payer: Self-pay

## 2020-04-08 ENCOUNTER — Emergency Department (HOSPITAL_COMMUNITY): Payer: BC Managed Care – PPO

## 2020-04-08 DIAGNOSIS — Z5321 Procedure and treatment not carried out due to patient leaving prior to being seen by health care provider: Secondary | ICD-10-CM | POA: Insufficient documentation

## 2020-04-08 DIAGNOSIS — H539 Unspecified visual disturbance: Secondary | ICD-10-CM | POA: Diagnosis not present

## 2020-04-08 DIAGNOSIS — G4453 Primary thunderclap headache: Secondary | ICD-10-CM | POA: Diagnosis not present

## 2020-04-08 DIAGNOSIS — R519 Headache, unspecified: Secondary | ICD-10-CM | POA: Diagnosis not present

## 2020-04-08 DIAGNOSIS — H538 Other visual disturbances: Secondary | ICD-10-CM | POA: Insufficient documentation

## 2020-04-08 DIAGNOSIS — R42 Dizziness and giddiness: Secondary | ICD-10-CM | POA: Diagnosis not present

## 2020-04-08 LAB — COMPREHENSIVE METABOLIC PANEL
ALT: 25 U/L (ref 0–44)
AST: 23 U/L (ref 15–41)
Albumin: 4.2 g/dL (ref 3.5–5.0)
Alkaline Phosphatase: 25 U/L — ABNORMAL LOW (ref 38–126)
Anion gap: 9 (ref 5–15)
BUN: 11 mg/dL (ref 8–23)
CO2: 26 mmol/L (ref 22–32)
Calcium: 9.2 mg/dL (ref 8.9–10.3)
Chloride: 99 mmol/L (ref 98–111)
Creatinine, Ser: 0.6 mg/dL (ref 0.44–1.00)
GFR calc Af Amer: >60 mL/min (ref >60)
GFR calc non Af Amer: >60 mL/min (ref >60)
Glucose, Bld: 98 mg/dL (ref 70–99)
Potassium: 4.3 mmol/L (ref 3.5–5.1)
Sodium: 134 mmol/L — ABNORMAL LOW (ref 135–145)
Total Bilirubin: 0.6 mg/dL (ref 0.3–1.2)
Total Protein: 6.5 g/dL (ref 6.5–8.1)

## 2020-04-08 LAB — I-STAT CHEM 8, ED
BUN: 11 mg/dL (ref 8–23)
Calcium, Ion: 1.16 mmol/L (ref 1.15–1.40)
Chloride: 98 mmol/L (ref 98–111)
Creatinine, Ser: 0.5 mg/dL (ref 0.44–1.00)
Glucose, Bld: 95 mg/dL (ref 70–99)
HCT: 39 % (ref 36.0–46.0)
Hemoglobin: 13.3 g/dL (ref 12.0–15.0)
Potassium: 4.1 mmol/L (ref 3.5–5.1)
Sodium: 135 mmol/L (ref 135–145)
TCO2: 25 mmol/L (ref 22–32)

## 2020-04-08 LAB — CBC
HCT: 39.9 % (ref 36.0–46.0)
Hemoglobin: 12.9 g/dL (ref 12.0–15.0)
MCH: 29.6 pg (ref 26.0–34.0)
MCHC: 32.3 g/dL (ref 30.0–36.0)
MCV: 91.5 fL (ref 80.0–100.0)
Platelets: 278 10*3/uL (ref 150–400)
RBC: 4.36 MIL/uL (ref 3.87–5.11)
RDW: 11.7 % (ref 11.5–15.5)
WBC: 6.5 10*3/uL (ref 4.0–10.5)
nRBC: 0 % (ref 0.0–0.2)

## 2020-04-08 LAB — DIFFERENTIAL
Abs Immature Granulocytes: 0.01 10*3/uL (ref 0.00–0.07)
Basophils Absolute: 0 10*3/uL (ref 0.0–0.1)
Basophils Relative: 1 %
Eosinophils Absolute: 0.1 10*3/uL (ref 0.0–0.5)
Eosinophils Relative: 1 %
Immature Granulocytes: 0 %
Lymphocytes Relative: 40 %
Lymphs Abs: 2.6 10*3/uL (ref 0.7–4.0)
Monocytes Absolute: 0.5 10*3/uL (ref 0.1–1.0)
Monocytes Relative: 7 %
Neutro Abs: 3.3 10*3/uL (ref 1.7–7.7)
Neutrophils Relative %: 51 %

## 2020-04-08 MED ORDER — SODIUM CHLORIDE 0.9% FLUSH
3.0000 mL | Freq: Once | INTRAVENOUS | Status: DC
Start: 2020-04-08 — End: 2020-04-09

## 2020-04-08 NOTE — ED Notes (Signed)
Pt wanted to leave the ED and pt was encouraged to stay. Pt left anyway. Pt's husband was upset due to staff not being able to assist with [facility] - My Chart.

## 2020-04-08 NOTE — ED Triage Notes (Signed)
Arrived via POV; from [facility] walk in clinic; here for headache and vision changes around 1300 today. Patient stated she took some tylenol and no relief.

## 2020-04-08 NOTE — ED Notes (Signed)
Call Bill at [phone] when pt goes back to a room

## 2020-04-13 DIAGNOSIS — E785 Hyperlipidemia, unspecified: Secondary | ICD-10-CM | POA: Diagnosis not present

## 2020-04-13 DIAGNOSIS — R519 Headache, unspecified: Secondary | ICD-10-CM | POA: Diagnosis not present

## 2020-04-19 ENCOUNTER — Encounter: Payer: Self-pay | Admitting: Neurology

## 2020-04-20 DIAGNOSIS — L738 Other specified follicular disorders: Secondary | ICD-10-CM | POA: Diagnosis not present

## 2020-04-20 DIAGNOSIS — L578 Other skin changes due to chronic exposure to nonionizing radiation: Secondary | ICD-10-CM | POA: Diagnosis not present

## 2020-04-20 DIAGNOSIS — D485 Neoplasm of uncertain behavior of skin: Secondary | ICD-10-CM | POA: Diagnosis not present

## 2020-04-20 DIAGNOSIS — L821 Other seborrheic keratosis: Secondary | ICD-10-CM | POA: Diagnosis not present

## 2020-04-20 DIAGNOSIS — D225 Melanocytic nevi of trunk: Secondary | ICD-10-CM | POA: Diagnosis not present

## 2020-07-04 NOTE — Progress Notes (Signed)
NEUROLOGY CONSULTATION NOTE  Margaret Parks MRN: [id number] DOB: [birth year]  Referring provider: London Pepper, MD Primary care provider: London Pepper, MD  Reason for consult:  headache   Subjective:  Margaret Parks is a 59 year old right-handed female who presents for headache.  History supplemented by referring provider's note.    On 04/08/2020, she was at work on her laptop when she developed sudden onset severe headache.  It was a stabbing that began in her left temple and then spread to her right temple.  She had associated nausea, photophobia, phonophobia, and dizziness as well as blurred vision that morphed into distorted decreased vision in her right visual field (a bright vertical lightening bolt where she couldn't see to the right of it).  There was no associated vomiting, speech disturbance or weakness.  Visual symptoms lasted 20-30 minutes and the headache lasted 45 minutes.  She presented to Urgent Care  was sent to the ED where she had a CT head, personally reviewed, that was normal.  She left the ED without being seen due to the long wait.  She hasn't had any recurrence.  She reports that she was under stress that day and also hadn't slept too well the previous night.  She denies prior history of migraines.  She said when she was a teenager, she had "sinus headaches" that were manageable and didn't require her to lay down.  From time to time, she may feel a stress headache coming on, for which she takes a Tylenol.  Family history:  Her younger sister has migraines.  Her brother had a glioblastoma.  Her mother had a stroke.   PAST MEDICAL HISTORY: Past Medical History:  Diagnosis Date  . Family history of glioblastoma   . Family history of ovarian cancer     PAST SURGICAL HISTORY: Past Surgical History:  Procedure Laterality Date  . WISDOM TOOTH EXTRACTION      MEDICATIONS: No current outpatient medications on file prior to visit.   No current  facility-administered medications on file prior to visit.    ALLERGIES: No Known Allergies  FAMILY HISTORY: Family History  Problem Relation Age of Onset  . Stroke Mother   . Heart attack Father   . Ovarian cancer Sister 45       unilateral fallopian tube cancer  . Colon polyps Brother   . Ovarian cancer Paternal Aunt 77  . Heart attack Paternal Uncle   . Multiple myeloma Maternal Grandmother   . Bone cancer Maternal Grandfather   . Heart attack Paternal Grandfather   . Brain cancer Brother 96       glioblastoma  . Lung cancer Maternal Uncle   . Breast cancer Neg Hx     SOCIAL HISTORY: Social History   Socioeconomic History  . Marital status: Married    Spouse name: Not on file  . Number of children: 2  . Years of education: Not on file  . Highest education level: Not on file  Occupational History  . Not on file  Tobacco Use  . Smoking status: Never Smoker  Substance and Sexual Activity  . Alcohol use: Yes    Comment: very occasional  . Drug use: Not on file  . Sexual activity: Not on file  Other Topics Concern  . Not on file  Social History Narrative  . Not on file   Social Determinants of Health   Financial Resource Strain:   . Difficulty of Paying Living Expenses: Not on file  Food Insecurity:   . Worried About Charity fundraiser in the Last Year: Not on file  . Ran Out of Food in the Last Year: Not on file  Transportation Needs:   . Lack of Transportation (Medical): Not on file  . Lack of Transportation (Non-Medical): Not on file  Physical Activity:   . Days of Exercise per Week: Not on file  . Minutes of Exercise per Session: Not on file  Stress:   . Feeling of Stress : Not on file  Social Connections:   . Frequency of Communication with Friends and Family: Not on file  . Frequency of Social Gatherings with Friends and Family: Not on file  . Attends Religious Services: Not on file  . Active Member of Clubs or Organizations: Not on file  .  Attends Archivist Meetings: Not on file  . Marital Status: Not on file  Intimate Partner Violence:   . Fear of Current or Ex-Partner: Not on file  . Emotionally Abused: Not on file  . Physically Abused: Not on file  . Sexually Abused: Not on file    Objective:  Blood pressure 126/67, pulse 75, resp. rate 18, height _0  (1.575 m), weight 125 lb (56.7 kg), SpO2 (!) 36 %. General: No acute distress.  Patient appears well-groomed.   Head:  Normocephalic/atraumatic Eyes:  fundi examined but not visualized Neck: supple, no paraspinal tenderness, full range of motion Back: No paraspinal tenderness Heart: regular rate and rhythm Lungs: Clear to auscultation bilaterally. Vascular: No carotid bruits. Neurological Exam: Mental status: alert and oriented to person, place, and time, recent and remote memory intact, fund of knowledge intact, attention and concentration intact, speech fluent and not dysarthric, language intact. Cranial nerves: CN I: not tested CN II: pupils equal, round and reactive to light, visual fields intact CN III, IV, VI:  full range of motion, no nystagmus, no ptosis CN V: facial sensation intact. CN VII: upper and lower face symmetric CN VIII: hearing intact CN IX, X: gag intact, uvula midline CN XI: sternocleidomastoid and trapezius muscles intact CN XII: tongue midline Bulk & Tone: normal, no fasciculations. Motor:  muscle strength 5/5 throughout Sensation:  Pinprick, temperature and vibratory sensation intact. Deep Tendon Reflexes:  2+ throughout,  toes downgoing.   Finger to nose testing:  Without dysmetria.   Heel to shin:  Without dysmetria.   Gait:  Normal station and stride.  Romberg negative.  Assessment/Plan:   Migraine with aura, without status migrainosus, not intractable.  Semiology is classic for migraine with aura, although it would be unusual for a first migraine at age 4.  CT head negative.  As she has not had any recurrence and her  neurologic exam is normal, I would defer further imaging with MRI.  She will follow up as needed.   Thank you for allowing me to take part in the care of this patient.  Metta Clines, DO  CC: London Pepper, MD

## 2020-07-05 ENCOUNTER — Other Ambulatory Visit: Payer: Self-pay

## 2020-07-05 ENCOUNTER — Ambulatory Visit (INDEPENDENT_AMBULATORY_CARE_PROVIDER_SITE_OTHER): Payer: BC Managed Care – PPO | Admitting: Neurology

## 2020-07-05 ENCOUNTER — Encounter: Payer: Self-pay | Admitting: Neurology

## 2020-07-05 VITALS — BP 126/67 | HR 75 | Resp 18 | Ht 62.0 in | Wt 125.0 lb

## 2020-07-05 DIAGNOSIS — G43109 Migraine with aura, not intractable, without status migrainosus: Secondary | ICD-10-CM | POA: Diagnosis not present

## 2020-07-05 NOTE — Patient Instructions (Signed)
   1. Limit use of pain relievers to no more than 2 days out of the week.  These medications include acetaminophen, NSAIDs (ibuprofen/Advil/Motrin, naproxen/Aleve, triptans (Imitrex/sumatriptan), Excedrin, and narcotics.  This will help reduce risk of rebound headaches. 2. Be aware of common food triggers:  - Caffeine:  coffee, black tea, cola, Mt. Dew  - Chocolate  - Dairy:  aged cheeses (brie, blue, cheddar, gouda, Parmasan, provolone, romano, Swiss, etc), chocolate milk, buttermilk, sour cream, limit eggs and yogurt  - Nuts, peanut butter  - Alcohol  - Cereals/grains:  FRESH breads (fresh bagels, sourdough, doughnuts), yeast productions  - Processed/canned/aged/cured meats (pre-packaged deli meats, hotdogs)  - MSG/glutamate:  soy sauce, flavor enhancer, pickled/preserved/marinated foods  - Sweeteners:  aspartame (Equal, Nutrasweet).  Sugar and Splenda are okay  - Vegetables:  legumes (lima beans, lentils, snow peas, fava beans, pinto peans, peas, garbanzo beans), sauerkraut, onions, olives, pickles  - Fruit:  avocados, bananas, citrus fruit (orange, lemon, grapefruit), mango  - Other:  Frozen meals, macaroni and cheese 3. Routine exercise 4. Stay adequately hydrated (aim for 64 oz water daily) 5. Keep headache diary 6. Maintain proper stress management 7. Maintain proper sleep hygiene 8. Do not skip meals 9. Consider supplements:  magnesium citrate 400mg daily, riboflavin 400mg daily, coenzyme Q10 100mg three times daily.  

## 2020-08-22 DIAGNOSIS — Z0000 Encounter for general adult medical examination without abnormal findings: Secondary | ICD-10-CM | POA: Diagnosis not present

## 2020-08-22 DIAGNOSIS — Z23 Encounter for immunization: Secondary | ICD-10-CM | POA: Diagnosis not present

## 2020-08-22 DIAGNOSIS — E785 Hyperlipidemia, unspecified: Secondary | ICD-10-CM | POA: Diagnosis not present

## 2020-09-02 DIAGNOSIS — M25512 Pain in left shoulder: Secondary | ICD-10-CM | POA: Diagnosis not present

## 2020-09-02 DIAGNOSIS — R202 Paresthesia of skin: Secondary | ICD-10-CM | POA: Diagnosis not present

## 2020-09-27 DIAGNOSIS — Z8781 Personal history of (healed) traumatic fracture: Secondary | ICD-10-CM | POA: Diagnosis not present

## 2020-09-27 DIAGNOSIS — Z7983 Long term (current) use of bisphosphonates: Secondary | ICD-10-CM | POA: Diagnosis not present

## 2020-09-27 DIAGNOSIS — Z5181 Encounter for therapeutic drug level monitoring: Secondary | ICD-10-CM | POA: Diagnosis not present

## 2020-09-27 DIAGNOSIS — M810 Age-related osteoporosis without current pathological fracture: Secondary | ICD-10-CM | POA: Diagnosis not present

## 2020-09-27 DIAGNOSIS — Z8262 Family history of osteoporosis: Secondary | ICD-10-CM | POA: Diagnosis not present

## 2020-09-29 DIAGNOSIS — Z780 Asymptomatic menopausal state: Secondary | ICD-10-CM | POA: Diagnosis not present

## 2020-09-29 DIAGNOSIS — M8589 Other specified disorders of bone density and structure, multiple sites: Secondary | ICD-10-CM | POA: Diagnosis not present

## 2020-10-04 ENCOUNTER — Other Ambulatory Visit: Payer: Self-pay | Admitting: Obstetrics and Gynecology

## 2020-10-04 DIAGNOSIS — Z0000 Encounter for general adult medical examination without abnormal findings: Secondary | ICD-10-CM

## 2020-11-23 DIAGNOSIS — Z1211 Encounter for screening for malignant neoplasm of colon: Secondary | ICD-10-CM | POA: Diagnosis not present

## 2020-11-23 DIAGNOSIS — D123 Benign neoplasm of transverse colon: Secondary | ICD-10-CM | POA: Diagnosis not present

## 2020-11-23 DIAGNOSIS — K635 Polyp of colon: Secondary | ICD-10-CM | POA: Diagnosis not present

## 2020-12-12 ENCOUNTER — Other Ambulatory Visit: Payer: Self-pay

## 2020-12-12 ENCOUNTER — Ambulatory Visit
Admission: RE | Admit: 2020-12-12 | Discharge: 2020-12-12 | Disposition: A | Discharge status: Home / Self Care | Payer: BC Managed Care – PPO | Source: Ambulatory Visit | Attending: Obstetrics and Gynecology | Admitting: Obstetrics and Gynecology

## 2020-12-12 DIAGNOSIS — Z1231 Encounter for screening mammogram for malignant neoplasm of breast: Secondary | ICD-10-CM | POA: Diagnosis not present

## 2020-12-12 DIAGNOSIS — Z0000 Encounter for general adult medical examination without abnormal findings: Secondary | ICD-10-CM

## 2021-02-16 DIAGNOSIS — Z01419 Encounter for gynecological examination (general) (routine) without abnormal findings: Secondary | ICD-10-CM | POA: Diagnosis not present

## 2021-02-16 DIAGNOSIS — M8580 Other specified disorders of bone density and structure, unspecified site: Secondary | ICD-10-CM | POA: Diagnosis not present

## 2021-04-10 DIAGNOSIS — L821 Other seborrheic keratosis: Secondary | ICD-10-CM | POA: Diagnosis not present

## 2021-04-10 DIAGNOSIS — L570 Actinic keratosis: Secondary | ICD-10-CM | POA: Diagnosis not present

## 2021-04-20 DIAGNOSIS — L821 Other seborrheic keratosis: Secondary | ICD-10-CM | POA: Diagnosis not present

## 2021-04-20 DIAGNOSIS — L570 Actinic keratosis: Secondary | ICD-10-CM | POA: Diagnosis not present

## 2021-04-20 DIAGNOSIS — D225 Melanocytic nevi of trunk: Secondary | ICD-10-CM | POA: Diagnosis not present

## 2021-04-20 DIAGNOSIS — L578 Other skin changes due to chronic exposure to nonionizing radiation: Secondary | ICD-10-CM | POA: Diagnosis not present

## 2021-04-20 DIAGNOSIS — L738 Other specified follicular disorders: Secondary | ICD-10-CM | POA: Diagnosis not present

## 2021-06-20 DIAGNOSIS — M79671 Pain in right foot: Secondary | ICD-10-CM | POA: Diagnosis not present

## 2021-07-03 ENCOUNTER — Ambulatory Visit (INDEPENDENT_AMBULATORY_CARE_PROVIDER_SITE_OTHER): Payer: BC Managed Care – PPO | Admitting: Podiatry

## 2021-07-03 ENCOUNTER — Other Ambulatory Visit: Payer: Self-pay

## 2021-07-03 ENCOUNTER — Encounter: Payer: Self-pay | Admitting: Podiatry

## 2021-07-03 ENCOUNTER — Ambulatory Visit (INDEPENDENT_AMBULATORY_CARE_PROVIDER_SITE_OTHER): Payer: BC Managed Care – PPO

## 2021-07-03 ENCOUNTER — Other Ambulatory Visit: Payer: Self-pay | Admitting: Podiatry

## 2021-07-03 DIAGNOSIS — M79671 Pain in right foot: Secondary | ICD-10-CM

## 2021-07-03 DIAGNOSIS — M79672 Pain in left foot: Secondary | ICD-10-CM | POA: Diagnosis not present

## 2021-07-03 DIAGNOSIS — M19079 Primary osteoarthritis, unspecified ankle and foot: Secondary | ICD-10-CM | POA: Diagnosis not present

## 2021-07-03 DIAGNOSIS — M722 Plantar fascial fibromatosis: Secondary | ICD-10-CM | POA: Diagnosis not present

## 2021-07-03 MED ORDER — MELOXICAM 15 MG PO TABS: 15.0000 mg | ORAL_TABLET | Freq: Every day | ORAL | 0 refills | Status: DC

## 2021-07-03 NOTE — Progress Notes (Signed)
  Subjective:  Patient ID: Margaret Parks, female    DOB: 07-Jan-1961,   MRN: [id number]  Chief Complaint  Patient presents with   Foot Pain    Patient is complaining of bilateral foot pain.    60 y.o. female presents for bilateral foot pain that has been present for a couple weeks. Relates pain in her heel and occasionally in the top of her foot. Relates she walks 2 miles every night. Denies any injury . Has tried icing and ibuprofen with some relief. Has dealt with this problem in the past and was treated successfully. . Denies any other pedal complaints. Denies n/v/f/c.   Past Medical History:  Diagnosis Date   Family history of glioblastoma    Family history of ovarian cancer     Objective:  Physical Exam: Vascular: DP/PT pulses 2/4 bilateral. CFT <3 seconds. Normal hair growth on digits. No edema.  Skin. No lacerations or abrasions bilateral feet.  Musculoskeletal: MMT 5/5 bilateral lower extremities in DF, PF, Inversion and Eversion. Deceased ROM in DF of ankle joint. Tender to bilateral medial calcaneal tubercles. No tenderness along arch, achilles or PT tendon or with medial calcaneal squeeze. Some tenderness over dorsal first and second TMTJ.  Neurological: Sensation intact to light touch.   Assessment:   1. Plantar fasciitis, bilateral   2. Arthritis, midfoot      Plan:  Patient was evaluated and treated and all questions answered. X-rays reviewed and discussed with patient. No acute fractures or dislocations noted. Cavus foot type bilateral. Mild degenerative changes noted to midfoot.  Discussed plantar fasciitis with patient.  Discussed treatment options including, ice, NSAIDS, supportive shoes, bracing, and stretching. Stretching exercises provided to be done on a daily basis.   Prescription for meloxicam provided and sent to pharmacy. Continue with supportive inserts and shoes Patient denies injection today.  Follow-up 6 weeks or sooner if any problems arise. In  the meantime, encouraged to call the office with any questions, concerns, change in symptoms.      Louann Sjogren, DPM

## 2021-07-03 NOTE — Patient Instructions (Signed)

## 2021-08-15 ENCOUNTER — Encounter: Payer: Self-pay | Admitting: Podiatry

## 2021-08-15 ENCOUNTER — Ambulatory Visit (INDEPENDENT_AMBULATORY_CARE_PROVIDER_SITE_OTHER): Payer: BC Managed Care – PPO | Admitting: Podiatry

## 2021-08-15 ENCOUNTER — Other Ambulatory Visit: Payer: Self-pay

## 2021-08-15 DIAGNOSIS — M19079 Primary osteoarthritis, unspecified ankle and foot: Secondary | ICD-10-CM

## 2021-08-15 DIAGNOSIS — M722 Plantar fascial fibromatosis: Secondary | ICD-10-CM | POA: Diagnosis not present

## 2021-08-15 NOTE — Progress Notes (Signed)
°  Subjective:  Patient ID: Margaret Parks, female    DOB: 17-Jan-1961,   MRN: [id number]  Chief Complaint  Patient presents with   Plantar Fasciitis     F/U BL PF -pt states," been doing good just last couple of days with pain at Rt arch - 5/10 - no swelling tx: stretching, icing, inserts -pt d/c meloxicam due to stomach upset    60 y.o. female presents for follow-up of bilateral plantar fasciitis and midfoot arthritis. States she has been stretching and wearing new inserts. States her pain has improved more than 50%. States she has had some pain in the middle part of her arch especially on the cold days. Denies any other pedal complaints. Denies n/v/f/c.   Past Medical History:  Diagnosis Date   Family history of glioblastoma    Family history of ovarian cancer     Objective:  Physical Exam: Vascular: DP/PT pulses 2/4 bilateral. CFT <3 seconds. Normal hair growth on digits. No edema.  Skin. No lacerations or abrasions bilateral feet.  Musculoskeletal: MMT 5/5 bilateral lower extremities in DF, PF, Inversion and Eversion. Deceased ROM in DF of ankle joint. Non-tender to bilateral medial calcaneal tubercles. No tenderness along arch, achilles or PT tendon or with medial calcaneal squeeze. Some tenderness over dorsal first and second TMTJ.  Neurological: Sensation intact to light touch.   Assessment:   1. Plantar fasciitis, bilateral   2. Arthritis, midfoot      Plan:  Patient was evaluated and treated and all questions answered. X-rays reviewed and discussed with patient. No acute fractures or dislocations noted. Cavus foot type bilateral. Mild degenerative changes noted to midfoot.  Discussed plantar fasciitis with patient.  Discussed treatment options including, ice, NSAIDS, supportive shoes, bracing, and stretching. Stretching exercises provided to be done on a daily basis.   Continue with aleve as needed.  Continue with supportive inserts and shoes Discussed trying topical  voltaren for midfoot pain.  Patient denies injection today.  Follow-up as needed     Louann Sjogren, DPM

## 2021-09-05 DIAGNOSIS — Z0000 Encounter for general adult medical examination without abnormal findings: Secondary | ICD-10-CM | POA: Diagnosis not present

## 2021-09-05 DIAGNOSIS — E785 Hyperlipidemia, unspecified: Secondary | ICD-10-CM | POA: Diagnosis not present

## 2021-10-03 DIAGNOSIS — Z8781 Personal history of (healed) traumatic fracture: Secondary | ICD-10-CM | POA: Diagnosis not present

## 2021-10-03 DIAGNOSIS — Z8262 Family history of osteoporosis: Secondary | ICD-10-CM | POA: Diagnosis not present

## 2021-10-03 DIAGNOSIS — Z780 Asymptomatic menopausal state: Secondary | ICD-10-CM | POA: Diagnosis not present

## 2021-10-03 DIAGNOSIS — M810 Age-related osteoporosis without current pathological fracture: Secondary | ICD-10-CM | POA: Diagnosis not present

## 2021-10-03 DIAGNOSIS — Z5181 Encounter for therapeutic drug level monitoring: Secondary | ICD-10-CM | POA: Diagnosis not present

## 2021-10-27 DIAGNOSIS — R3915 Urgency of urination: Secondary | ICD-10-CM | POA: Diagnosis not present

## 2021-10-27 DIAGNOSIS — R300 Dysuria: Secondary | ICD-10-CM | POA: Diagnosis not present

## 2021-11-13 ENCOUNTER — Other Ambulatory Visit: Payer: Self-pay | Admitting: Obstetrics and Gynecology

## 2021-11-13 DIAGNOSIS — Z1231 Encounter for screening mammogram for malignant neoplasm of breast: Secondary | ICD-10-CM

## 2021-11-14 DIAGNOSIS — E785 Hyperlipidemia, unspecified: Secondary | ICD-10-CM | POA: Diagnosis not present

## 2021-11-22 DIAGNOSIS — D485 Neoplasm of uncertain behavior of skin: Secondary | ICD-10-CM | POA: Diagnosis not present

## 2021-11-22 DIAGNOSIS — L821 Other seborrheic keratosis: Secondary | ICD-10-CM | POA: Diagnosis not present

## 2021-11-22 DIAGNOSIS — L814 Other melanin hyperpigmentation: Secondary | ICD-10-CM | POA: Diagnosis not present

## 2021-12-14 ENCOUNTER — Ambulatory Visit
Admission: RE | Admit: 2021-12-14 | Discharge: 2021-12-14 | Disposition: A | Discharge status: Home / Self Care | Payer: BC Managed Care – PPO | Source: Ambulatory Visit | Attending: Obstetrics and Gynecology | Admitting: Obstetrics and Gynecology

## 2021-12-14 DIAGNOSIS — Z1231 Encounter for screening mammogram for malignant neoplasm of breast: Secondary | ICD-10-CM | POA: Diagnosis not present

## 2022-01-15 ENCOUNTER — Other Ambulatory Visit: Payer: Self-pay | Admitting: Podiatry

## 2022-03-07 DIAGNOSIS — Z01419 Encounter for gynecological examination (general) (routine) without abnormal findings: Secondary | ICD-10-CM | POA: Diagnosis not present

## 2022-03-27 DIAGNOSIS — R238 Other skin changes: Secondary | ICD-10-CM | POA: Diagnosis not present

## 2022-03-27 DIAGNOSIS — L509 Urticaria, unspecified: Secondary | ICD-10-CM | POA: Diagnosis not present

## 2022-04-05 DIAGNOSIS — M25552 Pain in left hip: Secondary | ICD-10-CM | POA: Diagnosis not present

## 2022-04-05 DIAGNOSIS — M25562 Pain in left knee: Secondary | ICD-10-CM | POA: Diagnosis not present

## 2022-04-05 DIAGNOSIS — M545 Low back pain, unspecified: Secondary | ICD-10-CM | POA: Diagnosis not present

## 2022-05-02 DIAGNOSIS — L821 Other seborrheic keratosis: Secondary | ICD-10-CM | POA: Diagnosis not present

## 2022-05-02 DIAGNOSIS — L820 Inflamed seborrheic keratosis: Secondary | ICD-10-CM | POA: Diagnosis not present

## 2022-05-02 DIAGNOSIS — L578 Other skin changes due to chronic exposure to nonionizing radiation: Secondary | ICD-10-CM | POA: Diagnosis not present

## 2022-05-02 DIAGNOSIS — L814 Other melanin hyperpigmentation: Secondary | ICD-10-CM | POA: Diagnosis not present

## 2022-05-02 DIAGNOSIS — D225 Melanocytic nevi of trunk: Secondary | ICD-10-CM | POA: Diagnosis not present

## 2022-06-11 DIAGNOSIS — M25562 Pain in left knee: Secondary | ICD-10-CM | POA: Diagnosis not present

## 2022-09-17 DIAGNOSIS — E785 Hyperlipidemia, unspecified: Secondary | ICD-10-CM | POA: Diagnosis not present

## 2022-09-17 DIAGNOSIS — Z0000 Encounter for general adult medical examination without abnormal findings: Secondary | ICD-10-CM | POA: Diagnosis not present

## 2022-09-21 ENCOUNTER — Other Ambulatory Visit (HOSPITAL_COMMUNITY): Payer: Self-pay | Admitting: Family Medicine

## 2022-09-21 DIAGNOSIS — E785 Hyperlipidemia, unspecified: Secondary | ICD-10-CM

## 2022-09-24 ENCOUNTER — Ambulatory Visit: Payer: BC Managed Care – PPO | Admitting: Podiatry

## 2022-10-01 DIAGNOSIS — M810 Age-related osteoporosis without current pathological fracture: Secondary | ICD-10-CM | POA: Diagnosis not present

## 2022-10-01 DIAGNOSIS — Z780 Asymptomatic menopausal state: Secondary | ICD-10-CM | POA: Diagnosis not present

## 2022-10-03 DIAGNOSIS — L570 Actinic keratosis: Secondary | ICD-10-CM | POA: Diagnosis not present

## 2022-10-23 ENCOUNTER — Ambulatory Visit (HOSPITAL_BASED_OUTPATIENT_CLINIC_OR_DEPARTMENT_OTHER): Payer: BC Managed Care – PPO

## 2022-10-23 DIAGNOSIS — Z8781 Personal history of (healed) traumatic fracture: Secondary | ICD-10-CM | POA: Diagnosis not present

## 2022-10-23 DIAGNOSIS — M810 Age-related osteoporosis without current pathological fracture: Secondary | ICD-10-CM | POA: Diagnosis not present

## 2022-10-23 DIAGNOSIS — Z8262 Family history of osteoporosis: Secondary | ICD-10-CM | POA: Diagnosis not present

## 2022-10-23 DIAGNOSIS — Z5181 Encounter for therapeutic drug level monitoring: Secondary | ICD-10-CM | POA: Diagnosis not present

## 2022-10-25 ENCOUNTER — Ambulatory Visit (HOSPITAL_BASED_OUTPATIENT_CLINIC_OR_DEPARTMENT_OTHER)
Admission: RE | Admit: 2022-10-25 | Discharge: 2022-10-25 | Disposition: A | Discharge status: Home / Self Care | Payer: BC Managed Care – PPO | Source: Ambulatory Visit | Attending: Family Medicine | Admitting: Family Medicine

## 2022-10-25 DIAGNOSIS — E785 Hyperlipidemia, unspecified: Secondary | ICD-10-CM | POA: Insufficient documentation

## 2022-10-31 ENCOUNTER — Other Ambulatory Visit: Payer: Self-pay | Admitting: Obstetrics and Gynecology

## 2022-10-31 DIAGNOSIS — Z1231 Encounter for screening mammogram for malignant neoplasm of breast: Secondary | ICD-10-CM

## 2022-11-23 DIAGNOSIS — M25562 Pain in left knee: Secondary | ICD-10-CM | POA: Diagnosis not present

## 2022-12-18 ENCOUNTER — Ambulatory Visit
Admission: RE | Admit: 2022-12-18 | Discharge: 2022-12-18 | Disposition: A | Discharge status: Home / Self Care | Payer: BC Managed Care – PPO | Source: Ambulatory Visit | Attending: Obstetrics and Gynecology | Admitting: Obstetrics and Gynecology

## 2022-12-18 DIAGNOSIS — Z1231 Encounter for screening mammogram for malignant neoplasm of breast: Secondary | ICD-10-CM

## 2022-12-24 DIAGNOSIS — E785 Hyperlipidemia, unspecified: Secondary | ICD-10-CM | POA: Diagnosis not present

## 2023-03-11 DIAGNOSIS — Z124 Encounter for screening for malignant neoplasm of cervix: Secondary | ICD-10-CM | POA: Diagnosis not present

## 2023-03-11 DIAGNOSIS — Z01419 Encounter for gynecological examination (general) (routine) without abnormal findings: Secondary | ICD-10-CM | POA: Diagnosis not present

## 2023-04-16 DIAGNOSIS — M25562 Pain in left knee: Secondary | ICD-10-CM | POA: Diagnosis not present

## 2023-05-15 DIAGNOSIS — D225 Melanocytic nevi of trunk: Secondary | ICD-10-CM | POA: Diagnosis not present

## 2023-05-15 DIAGNOSIS — L578 Other skin changes due to chronic exposure to nonionizing radiation: Secondary | ICD-10-CM | POA: Diagnosis not present

## 2023-05-15 DIAGNOSIS — Q825 Congenital non-neoplastic nevus: Secondary | ICD-10-CM | POA: Diagnosis not present

## 2023-05-15 DIAGNOSIS — Z85828 Personal history of other malignant neoplasm of skin: Secondary | ICD-10-CM | POA: Diagnosis not present

## 2023-06-18 DIAGNOSIS — R87610 Atypical squamous cells of undetermined significance on cytologic smear of cervix (ASC-US): Secondary | ICD-10-CM | POA: Diagnosis not present

## 2023-06-25 IMAGING — MG MM DIGITAL SCREENING BILAT W/ TOMO AND CAD
8 series · 8 of 24 positions shown · non-contrast
Comparison: Previous exam(s).

CLINICAL DATA: Screening.

EXAM:
DIGITAL SCREENING BILATERAL MAMMOGRAM WITH TOMOSYNTHESIS AND CAD
TECHNIQUE: Bilateral screening digital craniocaudal and mediolateral oblique
mammograms were obtained. Bilateral screening digital breast
tomosynthesis was performed. The images were evaluated with
computer-aided detection.

[R MLO synth-2D]
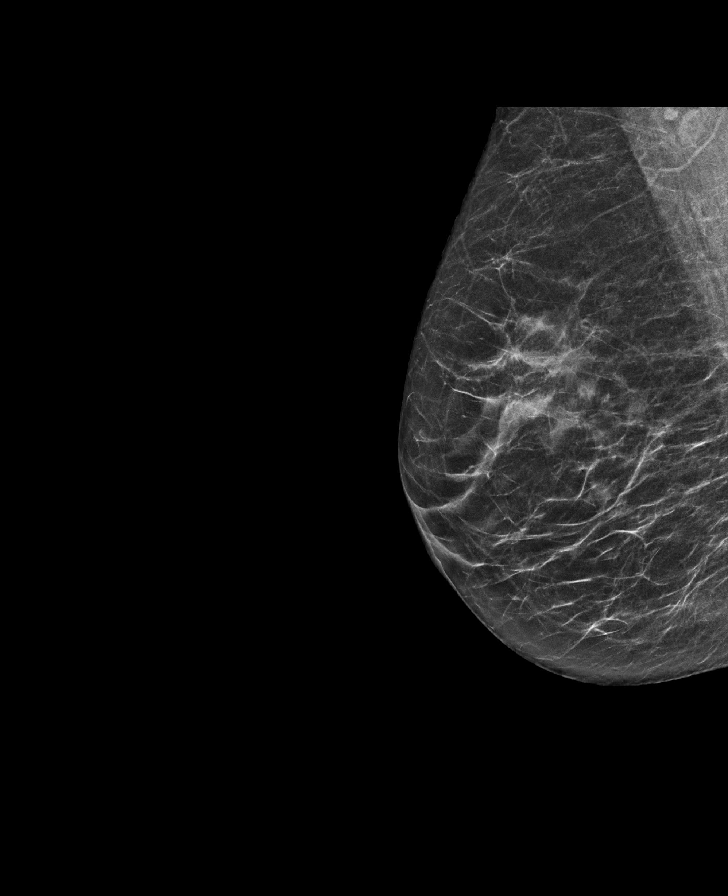

[R CC synth-2D]
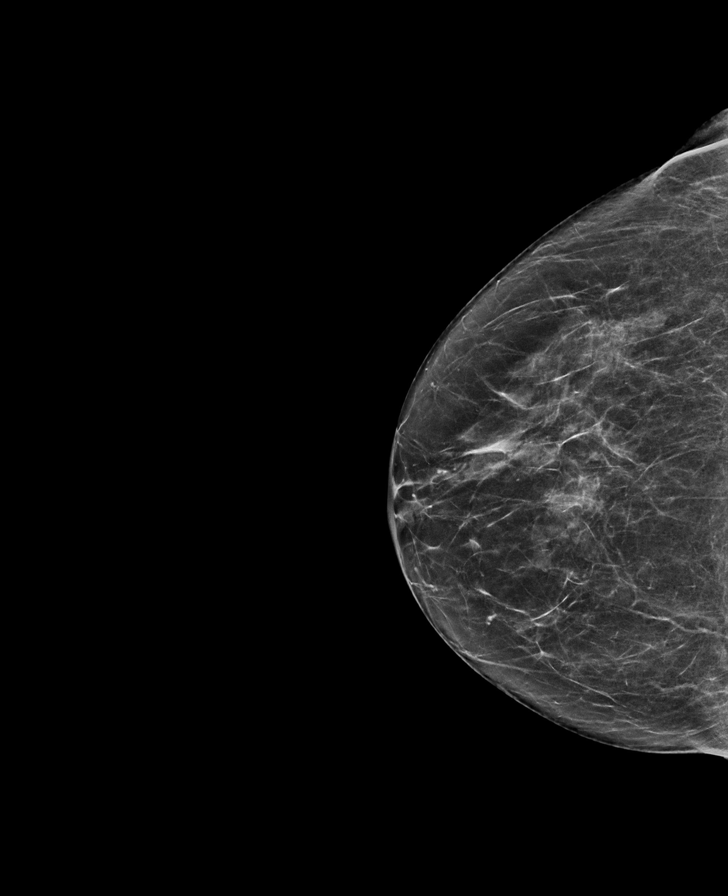

[L MLO synth-2D]
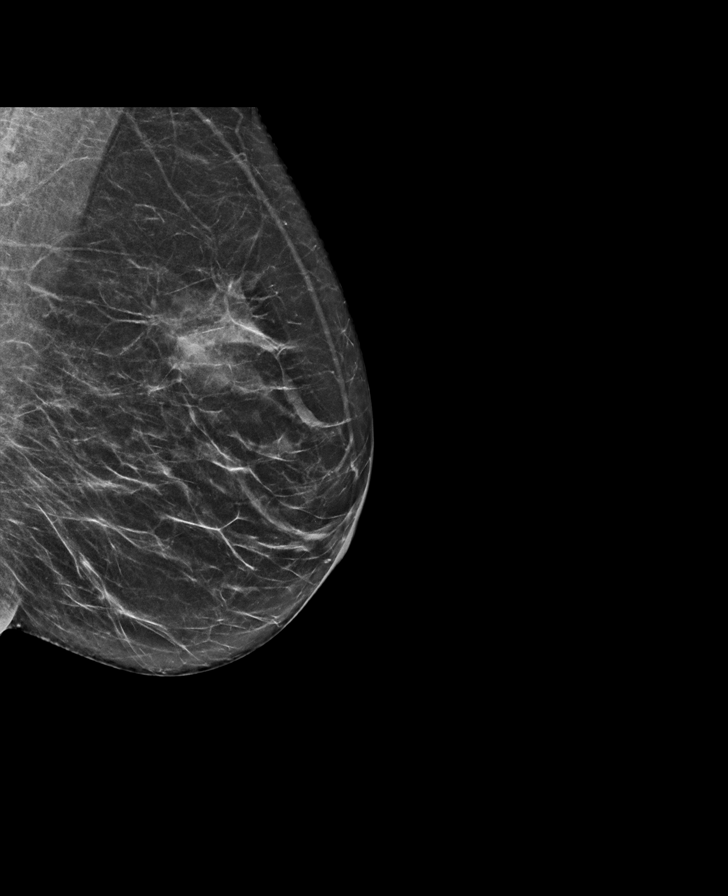

[L CC synth-2D]
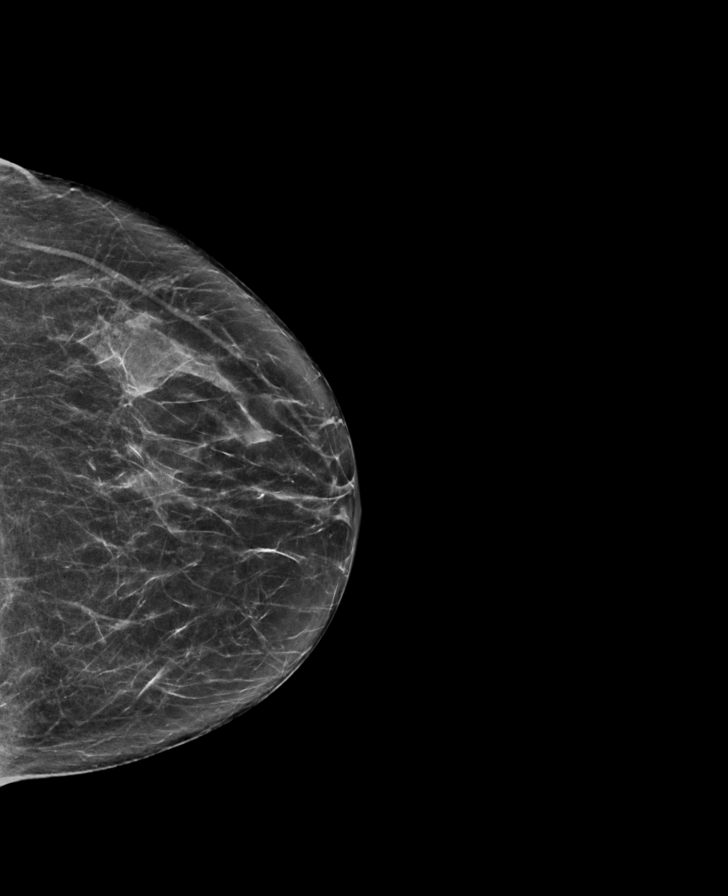

[L CC tomo · tomo slice 31/62.0]
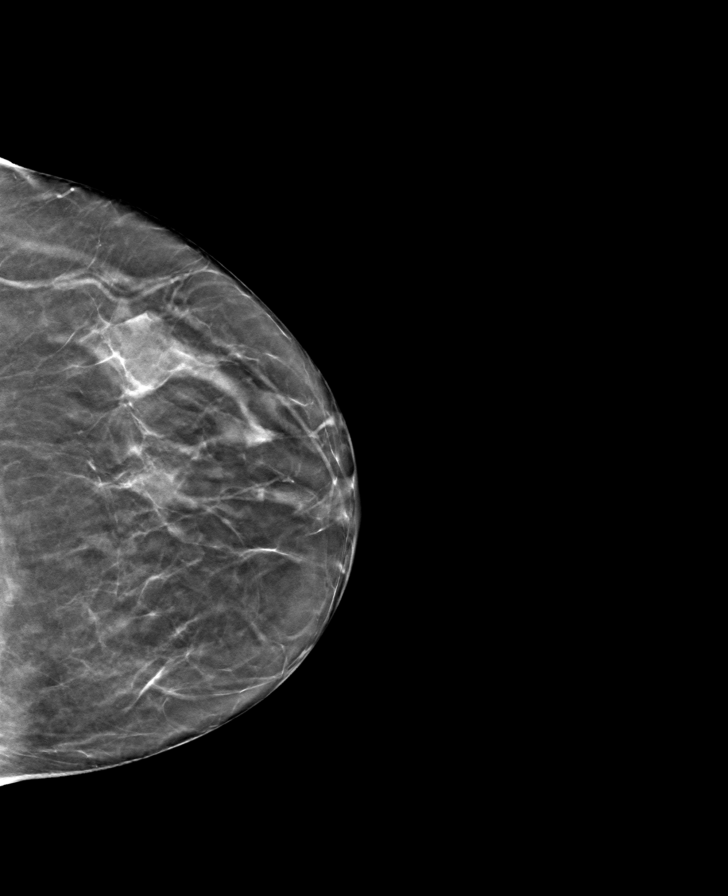

[L MLO tomo · tomo slice 31/61.0]
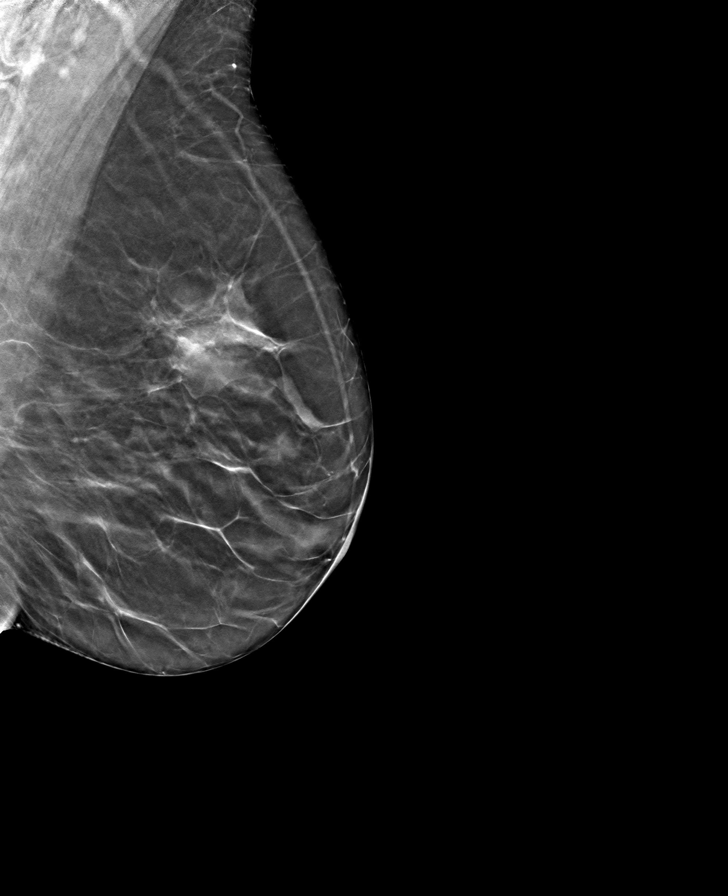

[R CC tomo · tomo slice 30/59.0]
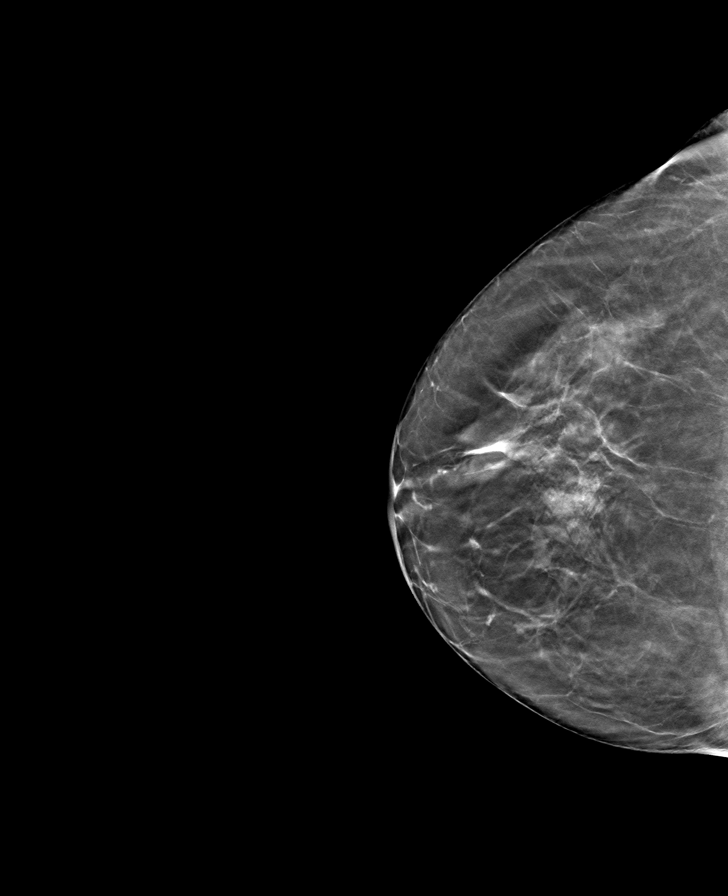

[R MLO tomo · tomo slice 29/56.0]
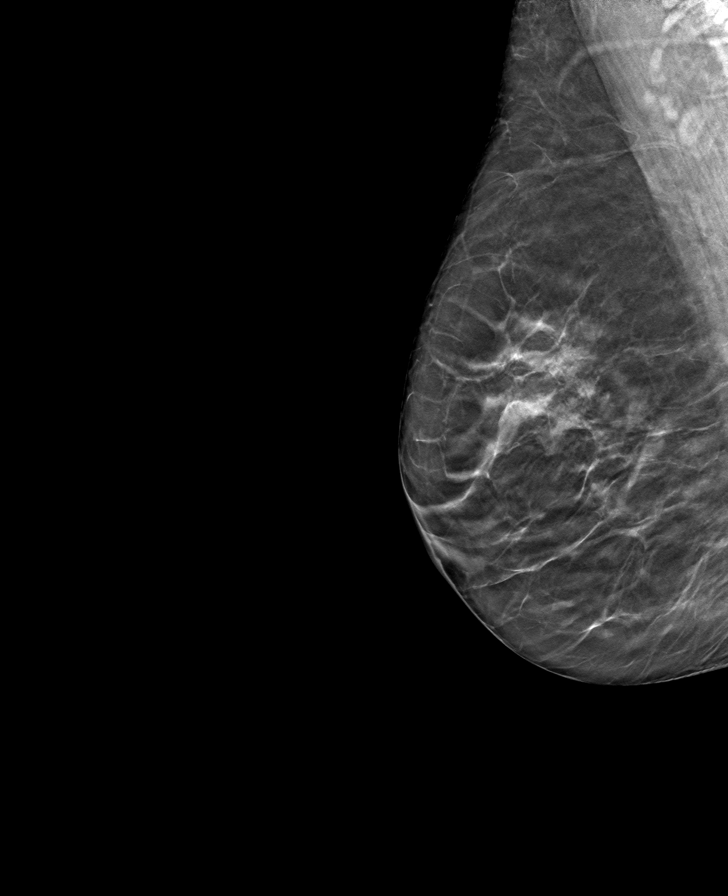

[8 of 24 positions shown; findings below may reference images not displayed]

ACR Breast Density Category c: The breast tissue is heterogeneously
dense, which may obscure small masses.
FINDINGS: There are no findings suspicious for malignancy.
IMPRESSION: No mammographic evidence of malignancy. A result letter of this
screening mammogram will be mailed directly to the patient.

RECOMMENDATION:
Screening mammogram in one year. (Code:Q3-W-BC3)

BI-RADS CATEGORY  1: Negative.

## 2023-07-02 DIAGNOSIS — L820 Inflamed seborrheic keratosis: Secondary | ICD-10-CM | POA: Diagnosis not present

## 2023-07-02 DIAGNOSIS — L570 Actinic keratosis: Secondary | ICD-10-CM | POA: Diagnosis not present

## 2023-07-19 DIAGNOSIS — M25562 Pain in left knee: Secondary | ICD-10-CM | POA: Diagnosis not present

## 2023-07-29 ENCOUNTER — Ambulatory Visit: Payer: BC Managed Care – PPO | Admitting: Podiatry

## 2023-08-08 DIAGNOSIS — M25562 Pain in left knee: Secondary | ICD-10-CM | POA: Diagnosis not present

## 2023-08-27 DIAGNOSIS — D692 Other nonthrombocytopenic purpura: Secondary | ICD-10-CM | POA: Diagnosis not present

## 2023-08-27 DIAGNOSIS — D485 Neoplasm of uncertain behavior of skin: Secondary | ICD-10-CM | POA: Diagnosis not present

## 2023-08-27 DIAGNOSIS — L820 Inflamed seborrheic keratosis: Secondary | ICD-10-CM | POA: Diagnosis not present

## 2023-09-11 ENCOUNTER — Encounter: Payer: Self-pay | Admitting: Podiatry

## 2023-09-11 ENCOUNTER — Ambulatory Visit (INDEPENDENT_AMBULATORY_CARE_PROVIDER_SITE_OTHER): Payer: BC Managed Care – PPO | Admitting: Podiatry

## 2023-09-11 DIAGNOSIS — M722 Plantar fascial fibromatosis: Secondary | ICD-10-CM

## 2023-09-11 MED ORDER — MELOXICAM 15 MG PO TABS: 15.0000 mg | ORAL_TABLET | Freq: Every day | ORAL | 0 refills | Status: AC

## 2023-09-11 NOTE — Patient Instructions (Signed)

## 2023-09-11 NOTE — Progress Notes (Signed)
  Subjective:  Patient ID: Margaret Parks, female    DOB: 01/13/1961,   MRN: [id number]  Chief Complaint  Patient presents with   Foot Pain    Pt presents for heel pain of left foot pt has history of pf and she thinks it might be coming back.    63 y.o. female presents for possible flare up of her left foot plantar fasciitis. Relates on Sunday she started getting more sharp pain in the heel and pain with first steps. She has been stretching occasionally and wearing support. Has tried using voltaren and ibuprofen which has helped.  Denies any other pedal complaints. Denies n/v/f/c.   Past Medical History:  Diagnosis Date   Family history of glioblastoma    Family history of ovarian cancer     Objective:  Physical Exam: Vascular: DP/PT pulses 2/4 bilateral. CFT <3 seconds. Normal hair growth on digits. No edema.  Skin. No lacerations or abrasions bilateral feet.  Musculoskeletal: MMT 5/5 bilateral lower extremities in DF, PF, Inversion and Eversion. Deceased ROM in DF of ankle joint. Non-tender to bilateral medial calcaneal tubercles. No tenderness along arch, achilles or PT tendon or with medial calcaneal squeeze. Some tenderness over dorsal first and second TMTJ.  Neurological: Sensation intact to light touch.   Assessment:   1. Plantar fasciitis of left foot      Plan:  Patient was evaluated and treated and all questions answered. X-rays reviewed and discussed with patient. No acute fractures or dislocations noted. Cavus foot type bilateral. Mild degenerative changes noted to midfoot.  Discussed plantar fasciitis with patient.  Discussed treatment options including, ice, NSAIDS, supportive shoes, bracing, and stretching. Stretching exercises provided to be done on a daily basis.   Meloxicam sent in to pharmacy.  Continue with supportive inserts and shoes Continue with volataren.  Patient denies injection today.  Follow-up in 6 weeks for recheck.      Louann Sjogren, DPM

## 2023-09-30 DIAGNOSIS — Z0000 Encounter for general adult medical examination without abnormal findings: Secondary | ICD-10-CM | POA: Diagnosis not present

## 2023-09-30 DIAGNOSIS — E785 Hyperlipidemia, unspecified: Secondary | ICD-10-CM | POA: Diagnosis not present

## 2023-10-23 ENCOUNTER — Ambulatory Visit: Payer: BC Managed Care – PPO | Admitting: Podiatry

## 2023-11-11 ENCOUNTER — Encounter: Payer: Self-pay | Admitting: Podiatry

## 2023-11-11 ENCOUNTER — Ambulatory Visit (INDEPENDENT_AMBULATORY_CARE_PROVIDER_SITE_OTHER): Payer: BC Managed Care – PPO | Admitting: Podiatry

## 2023-11-11 DIAGNOSIS — M722 Plantar fascial fibromatosis: Secondary | ICD-10-CM

## 2023-11-11 NOTE — Progress Notes (Signed)
  Subjective:  Patient ID: Margaret Parks, female    DOB: 1960-09-19,   MRN: [id number]  No chief complaint on file.   63 y.o. female presents for follow-up of left plantar fasciitis. Relates doing about 80% better. She has been strertching and wearing support. Does relate some new pain on the top of both feet. She has been spring cleaning and using the stairs more often.   Denies any other pedal complaints. Denies n/v/f/c.   Past Medical History:  Diagnosis Date   Family history of glioblastoma    Family history of ovarian cancer     Objective:  Physical Exam: Vascular: DP/PT pulses 2/4 bilateral. CFT <3 seconds. Normal hair growth on digits. No edema.  Skin. No lacerations or abrasions bilateral feet.  Musculoskeletal: MMT 5/5 bilateral lower extremities in DF, PF, Inversion and Eversion. Deceased ROM in DF of ankle joint. Non-tender to bilateral medial calcaneal tubercles. No tenderness along arch, achilles or PT tendon or with medial calcaneal squeeze. Some tenderness over dorsal first and second TMTJ.  Neurological: Sensation intact to light touch.   Assessment:   1. Plantar fasciitis of left foot       Plan:  Patient was evaluated and treated and all questions answered. X-rays reviewed and discussed with patient. No acute fractures or dislocations noted. Cavus foot type bilateral. Mild degenerative changes noted to midfoot.  Discussed plantar fasciitis with patient.  Discussed treatment options including, ice, NSAIDS, supportive shoes, bracing, and stretching. Stretching exercises provided to be done on a daily basis.   Continue with supportive inserts and shoes Continue with volataren.  Follow-up as needed     Louann Sjogren, DPM

## 2023-11-20 ENCOUNTER — Other Ambulatory Visit: Payer: Self-pay | Admitting: Obstetrics and Gynecology

## 2023-11-20 DIAGNOSIS — Z1231 Encounter for screening mammogram for malignant neoplasm of breast: Secondary | ICD-10-CM

## 2023-12-19 ENCOUNTER — Ambulatory Visit
Admission: RE | Admit: 2023-12-19 | Discharge: 2023-12-19 | Disposition: A | Discharge status: Home / Self Care | Source: Ambulatory Visit | Attending: Obstetrics and Gynecology | Admitting: Obstetrics and Gynecology

## 2023-12-19 DIAGNOSIS — Z1231 Encounter for screening mammogram for malignant neoplasm of breast: Secondary | ICD-10-CM

## 2024-03-06 ENCOUNTER — Encounter: Payer: Self-pay | Admitting: Advanced Practice Midwife
